# Patient Record
Sex: Female | Born: 1970 | Hispanic: No | Marital: Single | Smoking: Current some day smoker
Health system: Southern US, Community
[De-identification: ages and names within clinical notes are randomized; demographics above are authoritative.]

## PROBLEM LIST (undated history)

## (undated) HISTORY — PX: LEEP: SHX91

---

## 2021-09-30 ENCOUNTER — Telehealth: Payer: Self-pay

## 2021-09-30 NOTE — Telephone Encounter (Signed)
Telephoned patient at mobile number. Recording, patient cannot be reached at this time. [facility] unable to leave a voice message.

## 2021-10-10 NOTE — Telephone Encounter (Signed)
Telephoned patient at mobile number. Left a message with [facility] contact information. 

## 2021-10-23 NOTE — Progress Notes (Signed)
° ° ° ° ° ° °  GYNECOLOGY OFFICE VISIT NOTE  History:   Deborah Faulkner is a 51 y.o. [id number] here today for originally her colposcopy but then we realized no insurance and can be signed up for [facility].    She was wondering about something on her anal area. It has been bothering her for sometime. She has been doing A&D and vaseline and it has helped but still feels irritated at times.   She doesn't notice any bleeding and she tries not to scratch it.   She denies any abnormal vaginal discharge, bleeding, pelvic pain or other concerns.    History reviewed. No pertinent past medical history.  History reviewed. No pertinent surgical history.  The following portions of the patient's history were reviewed and updated as appropriate: allergies, current medications, past family history, past medical history, past social history, past surgical history and problem list.   Health Maintenance:   ASCUS/HPV pos No mammogram  Review of Systems:  Pertinent items noted in HPI and remainder of comprehensive ROS otherwise negative.  Physical Exam:  BP (!) 144/86    Pulse 78  CONSTITUTIONAL: Well-developed, well-nourished female in no acute distress.  HEENT:  Normocephalic, atraumatic. External right and left ear normal. No scleral icterus.  NECK: Normal range of motion, supple, no masses noted on observation SKIN: No rash noted. Not diaphoretic. No erythema. No pallor. MUSCULOSKELETAL: Normal range of motion. No edema noted. NEUROLOGIC: Alert and oriented to person, place, and time. Normal muscle tone coordination. No cranial nerve deficit noted. PSYCHIATRIC: Normal mood and affect. Normal behavior. Normal judgment and thought content.  CARDIOVASCULAR: Normal heart rate noted RESPIRATORY: Effort and breath sounds normal, no problems with respiration noted ABDOMEN: No masses noted. No other overt distention noted.    PELVIC: Normal appearing external genitalia; normal urethral meatus; normal appearing  vaginal mucosa and cervix.  No abnormal discharge noted.  Normal uterine size, no other palpable masses, no uterine or adnexal tenderness. Anal area is normal in appearance at this time except for small hemorrhoid. She points to the skin lateral to the anus that is bothersome to her, not posterior  Performed in the presence of a chaperone  Labs and Imaging No results found for this or any previous visit (from the past 168 hour(s)). No results found.  Assessment and Plan:  Siyah was seen today for procedure.  Diagnoses and all orders for this visit:  Atypical squamous cells of undetermined significance on cytologic smear of cervix (ASC-US) - She will get set up through [facility]  Positive depression screening -     Ambulatory referral to Integrated Behavioral Health - She will see intern today  Anal irritation -     triamcinolone (KENALOG) 0.025 % ointment; Apply 1 application topically 2 (two) times daily as needed. - Reviewed vulvar hygiene and avoid soaps. Reviewed cetaphil/cerave would be better I.e. cleansers - If topical steroid ointment doesn't work, recommended f/u PCP   Routine preventative health maintenance measures emphasized. Please refer to After Visit Summary for other counseling recommendations.   No follow-ups on file. - [facility] notified of pt.   Milas Hock, MD, FACOG Obstetrician & Gynecologist, [facility], [facility]

## 2021-10-24 ENCOUNTER — Telehealth: Payer: Self-pay

## 2021-10-24 ENCOUNTER — Ambulatory Visit (INDEPENDENT_AMBULATORY_CARE_PROVIDER_SITE_OTHER): Payer: Self-pay | Admitting: Obstetrics and Gynecology

## 2021-10-24 ENCOUNTER — Ambulatory Visit: Payer: Self-pay | Admitting: Clinical

## 2021-10-24 ENCOUNTER — Other Ambulatory Visit: Payer: Self-pay

## 2021-10-24 ENCOUNTER — Encounter: Payer: Self-pay | Admitting: Obstetrics and Gynecology

## 2021-10-24 ENCOUNTER — Encounter (INDEPENDENT_AMBULATORY_CARE_PROVIDER_SITE_OTHER): Payer: Self-pay | Admitting: Clinical

## 2021-10-24 VITALS — BP 144/86 | HR 78

## 2021-10-24 DIAGNOSIS — Z5941 Food insecurity: Secondary | ICD-10-CM

## 2021-10-24 DIAGNOSIS — F4322 Adjustment disorder with anxiety: Secondary | ICD-10-CM

## 2021-10-24 DIAGNOSIS — K6289 Other specified diseases of anus and rectum: Secondary | ICD-10-CM

## 2021-10-24 DIAGNOSIS — R87610 Atypical squamous cells of undetermined significance on cytologic smear of cervix (ASC-US): Secondary | ICD-10-CM

## 2021-10-24 DIAGNOSIS — Z1331 Encounter for screening for depression: Secondary | ICD-10-CM

## 2021-10-24 MED ORDER — TRIAMCINOLONE ACETONIDE 0.025 % EX OINT: 1.0000 "application " | TOPICAL_OINTMENT | Freq: Two times a day (BID) | CUTANEOUS | 1 refills | Status: DC | PRN

## 2021-10-24 NOTE — Addendum Note (Signed)
Addended by: Maxwell Marion E on: 10/24/2021 10:40 AM   Modules accepted: Orders

## 2021-10-24 NOTE — Progress Notes (Deleted)
Integrated Behavioral Health Initial In-Person Visit °  °MRN: [id number] °Name: Deborah Faulkner °  °Number of Integrated Behavioral Health Clinician visits:: 1/6 °Session Start time: 10:25 am  Session End time: 11:26 am °Total time: 60 minutes °  °Types of Service: Individual psychotherapy °  °Interpretor:No. Interpretor Name and Language: English °  °  Warm Hand Off Completed. °   °Yes °   °  °  °Subjective: °Deborah Faulkner is a 51 y.o. female accompanied by  N/A °Patient was referred by Paula Duncan, MD for Positive depression screening. °Patient reports the following symptoms/concerns: adjustment disorder with anxiety °Duration of problem: 7 months; Severity of problem: mild °  °Objective: °Mood: Anxious and Affect: Appropriate °Risk of harm to self or others: No plan to harm self or others °  °Life Context: °Family and Social: Big family and supportive boyfriend °School/Work: Not specified °Self-Care: None at the moment °Life Changes: Currently experiencing obstacles with shelter °  °Patient and/or Family's Strengths/Protective Factors: °Social connections, Social and Emotional competence, and Sense of purpose °  °Goals Addressed: °Patient will: °Reduce symptoms of: anxiety °Increase knowledge and/or ability of: stress reduction  °Demonstrate ability to: Increase healthy adjustment to current life circumstances °  °Progress towards Goals: °Ongoing and Other °  °Interventions: °Interventions utilized: Solution-Focused Strategies  °Standardized Assessments completed: GAD-7 and PHQ 9 °  °Patient and/or Family Response: Patient was receptive to therapist today. She believes that she will be ok, and is not seeking to continue therapy. She is interested in medication for anxiety and sleep. °  °Patient Centered Plan: °Patient is on the following Treatment Plan(s):  Patient would like to receive medications for anxiety and something to help her get sleep. °  °Assessment: °Patient currently experiencing anxiety. °  °Patient may  benefit from medication. °  °Plan: °Follow up with behavioral health clinician on : Patient does not want to follow up with therapist at this time, but will call if she changes her mind. °Behavioral recommendations: medication for anxiety and sleep °Referral(s):  None at this time. °"From scale of 1-10, how likely are you to follow plan?": 10 °  °Deborah Faulkner °

## 2021-10-24 NOTE — Progress Notes (Deleted)
Integrated Behavioral Health Initial In-Person Visit °  °MRN: [id number] °Name: Deborah Faulkner °  °Number of Integrated Behavioral Health Clinician visits:: 1/6 °Session Start time: 10:25 am  Session End time: 11:26 am °Total time: 60 minutes °  °Types of Service: Individual psychotherapy °  °Interpretor:No. Interpretor Name and Language: English °  °  Warm Hand Off Completed. °   °Yes °   °  °  °Subjective: °Deborah Faulkner is a 51 y.o. female accompanied by  N/A °Patient was referred by Paula Duncan, MD for Positive depression screening. °Patient reports the following symptoms/concerns: adjustment disorder with anxiety °Duration of problem: 7 months; Severity of problem: mild °  °Objective: °Mood: Anxious and Affect: Appropriate °Risk of harm to self or others: No plan to harm self or others °  °Life Context: °Family and Social: Big family and supportive boyfriend °School/Work: Not specified °Self-Care: None at the moment °Life Changes: Currently experiencing obstacles with shelter °  °Patient and/or Family's Strengths/Protective Factors: °Social connections, Social and Emotional competence, and Sense of purpose °  °Goals Addressed: °Patient will: °Reduce symptoms of: anxiety °Increase knowledge and/or ability of: stress reduction  °Demonstrate ability to: Increase healthy adjustment to current life circumstances °  °Progress towards Goals: °Ongoing and Other °  °Interventions: °Interventions utilized: Solution-Focused Strategies  °Standardized Assessments completed: GAD-7 and PHQ 9 °  °Patient and/or Family Response: Patient was receptive to therapist today. She believes that she will be ok, and is not seeking to continue therapy. She is interested in medication for anxiety and sleep. °  °Patient Centered Plan: °Patient is on the following Treatment Plan(s):  Patient would like to receive medications for anxiety and something to help her get sleep. °  °Assessment: °Patient currently experiencing anxiety. °  °Patient may  benefit from medication. °  °Plan: °Follow up with behavioral health clinician on : Patient does not want to follow up with therapist at this time, but will call if she changes her mind. °Behavioral recommendations: medication for anxiety and sleep °Referral(s):  None at this time. °"From scale of 1-10, how likely are you to follow plan?": 10 °  °[name] °

## 2021-10-24 NOTE — Patient Instructions (Signed)
For cleansers: Cetaphil or Cerave

## 2021-10-24 NOTE — Telephone Encounter (Signed)
Referral placed to [facility] during gyn visit today. Message received from [place] that pt is not eligible if she still has active [place] Medicaid. Called pt to review. Pt states she does not think [place] Medicaid is still active. Asked pt to confirm. [facility] notified to follow up with patient.

## 2021-10-24 NOTE — BH Specialist Note (Signed)
Integrated Behavioral Health Initial In-Person Visit   MRN: [id number] Name: Deborah Faulkner   Number of Integrated Behavioral Health Clinician visits:: 1/6 Session Start time: 10:25 am  Session End time: 11:26 am Total time: 60 minutes   Types of Service: Individual psychotherapy   Interpretor:No. Interpretor Name and Language: English     Warm Hand Off Completed.    Yes        Subjective: Deborah Faulkner is a 51 y.o. female accompanied by  N/A Patient was referred by Milas Hock, MD for Positive depression screening. Patient reports the following symptoms/concerns: adjustment disorder with anxiety Duration of problem: 7 months; Severity of problem: mild   Objective: Mood: Anxious and Affect: Appropriate Risk of harm to self or others: No plan to harm self or others   Life Context: Family and Social: Big family and supportive boyfriend School/Work: Not specified Self-Care: None at the moment Life Changes: Currently experiencing obstacles with shelter   Patient and/or Family's Strengths/Protective Factors: Social connections, Social and Emotional competence, and Sense of purpose   Goals Addressed: Patient will: Reduce symptoms of: anxiety Increase knowledge and/or ability of: stress reduction  Demonstrate ability to: Increase healthy adjustment to current life circumstances   Progress towards Goals: Ongoing and Other   Interventions: Interventions utilized: Solution-Focused Strategies  Standardized Assessments completed: GAD-7 and PHQ 9   Patient and/or Family Response: Patient was receptive to therapist today. She believes that she will be ok, and is not seeking to continue therapy. She is interested in medication for anxiety and sleep.   Patient Centered Plan: Patient is on the following Treatment Plan(s):  Patient would like to receive medications for anxiety and something to help her get sleep.   Assessment: Patient currently experiencing anxiety.   Patient may  benefit from medication.   Plan: Follow up with behavioral health clinician on : Patient does not want to follow up with therapist at this time, but will call if she changes her mind. Behavioral recommendations: medication for anxiety and sleep Referral(s):  None at this time. "From scale of 1-10, how likely are you to follow plan?": 10   [name]

## 2021-10-27 NOTE — Progress Notes (Signed)
error 

## 2021-10-30 NOTE — Telephone Encounter (Signed)
[facility] requests I follow up with patient regarding insurance. Called pt. Patient states she confirmed with insurance that it is no longer active. [facility] phone number given to patient to schedule. Pt reports neck pain after sleeping, would like to be evaluated for this. Explained this is not something that can be addressed by our office. Information given for [facility] and [facility].

## 2021-11-04 ENCOUNTER — Other Ambulatory Visit: Payer: Self-pay | Admitting: Obstetrics and Gynecology

## 2021-11-04 DIAGNOSIS — Z1231 Encounter for screening mammogram for malignant neoplasm of breast: Secondary | ICD-10-CM

## 2021-11-06 ENCOUNTER — Emergency Department (HOSPITAL_COMMUNITY)
Admission: EM | Admit: 2021-11-06 | Discharge: 2021-11-06 | Disposition: A | Discharge status: Home / Self Care | Payer: Self-pay | Attending: Emergency Medicine | Admitting: Emergency Medicine

## 2021-11-06 ENCOUNTER — Emergency Department (HOSPITAL_COMMUNITY): Payer: Self-pay

## 2021-11-06 ENCOUNTER — Other Ambulatory Visit: Payer: Self-pay

## 2021-11-06 DIAGNOSIS — M542 Cervicalgia: Secondary | ICD-10-CM | POA: Insufficient documentation

## 2021-11-06 DIAGNOSIS — T07XXXA Unspecified multiple injuries, initial encounter: Secondary | ICD-10-CM

## 2021-11-06 DIAGNOSIS — S0990XA Unspecified injury of head, initial encounter: Secondary | ICD-10-CM

## 2021-11-06 DIAGNOSIS — S0093XA Contusion of unspecified part of head, initial encounter: Secondary | ICD-10-CM | POA: Insufficient documentation

## 2021-11-06 MED ORDER — IBUPROFEN 600 MG PO TABS: 600.0000 mg | ORAL_TABLET | Freq: Three times a day (TID) | ORAL | 0 refills | Status: DC | PRN

## 2021-11-06 MED ORDER — HYDROCODONE-ACETAMINOPHEN 5-325 MG PO TABS
1.0000 | ORAL_TABLET | Freq: Four times a day (QID) | ORAL | 0 refills | Status: DC | PRN
Start: 2021-11-06 — End: 2023-01-12

## 2021-11-06 MED ORDER — IBUPROFEN 400 MG PO TABS
600.0000 mg | ORAL_TABLET | Freq: Once | ORAL | Status: AC
  Administered 2021-11-06: 600 mg via ORAL
  Filled 2021-11-06: qty 1
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
## 2022-01-08 ENCOUNTER — Inpatient Hospital Stay: Admission: RE | Admit: 2022-01-08 | Payer: Self-pay | Source: Ambulatory Visit

## 2022-01-08 ENCOUNTER — Ambulatory Visit: Payer: Self-pay

## 2022-01-08 DIAGNOSIS — Z1211 Encounter for screening for malignant neoplasm of colon: Secondary | ICD-10-CM

## 2022-01-14 ENCOUNTER — Ambulatory Visit: Payer: Self-pay | Admitting: Obstetrics and Gynecology

## 2022-01-29 ENCOUNTER — Ambulatory Visit: Payer: Self-pay | Admitting: Family Medicine

## 2022-02-26 ENCOUNTER — Telehealth: Payer: Self-pay | Admitting: Family Medicine

## 2022-02-26 NOTE — Telephone Encounter (Signed)
Called patient to reschedule appointment, there was no answer to the phone call so a voicemail was left with the call back number for the office and a letter was mailed.

## 2022-03-10 ENCOUNTER — Ambulatory Visit: Payer: Self-pay

## 2022-03-10 ENCOUNTER — Other Ambulatory Visit: Payer: Self-pay | Admitting: Obstetrics & Gynecology

## 2022-03-10 ENCOUNTER — Ambulatory Visit
Admission: RE | Admit: 2022-03-10 | Discharge: 2022-03-10 | Disposition: A | Discharge status: Home / Self Care | Payer: Medicaid Other | Source: Ambulatory Visit | Attending: Obstetrics & Gynecology | Admitting: Obstetrics & Gynecology

## 2022-03-10 DIAGNOSIS — Z1231 Encounter for screening mammogram for malignant neoplasm of breast: Secondary | ICD-10-CM

## 2022-03-10 DIAGNOSIS — Z1211 Encounter for screening for malignant neoplasm of colon: Secondary | ICD-10-CM

## 2022-03-20 ENCOUNTER — Ambulatory Visit: Payer: Self-pay | Admitting: Obstetrics & Gynecology

## 2022-03-31 ENCOUNTER — Ambulatory Visit: Payer: Self-pay | Admitting: Family Medicine

## 2022-04-16 ENCOUNTER — Ambulatory Visit: Payer: Self-pay | Admitting: Obstetrics & Gynecology

## 2022-07-29 ENCOUNTER — Ambulatory Visit (INDEPENDENT_AMBULATORY_CARE_PROVIDER_SITE_OTHER): Payer: Medicaid Other | Admitting: Obstetrics and Gynecology

## 2022-07-29 ENCOUNTER — Other Ambulatory Visit: Payer: Self-pay

## 2022-07-29 ENCOUNTER — Encounter: Payer: Self-pay | Admitting: Obstetrics and Gynecology

## 2022-07-29 DIAGNOSIS — N951 Menopausal and female climacteric states: Secondary | ICD-10-CM

## 2022-07-29 DIAGNOSIS — R87810 Cervical high risk human papillomavirus (HPV) DNA test positive: Secondary | ICD-10-CM

## 2022-07-29 DIAGNOSIS — R87610 Atypical squamous cells of undetermined significance on cytologic smear of cervix (ASC-US): Secondary | ICD-10-CM | POA: Diagnosis not present

## 2022-07-29 DIAGNOSIS — N882 Stricture and stenosis of cervix uteri: Secondary | ICD-10-CM | POA: Diagnosis not present

## 2022-07-29 DIAGNOSIS — Z3202 Encounter for pregnancy test, result negative: Secondary | ICD-10-CM

## 2022-07-29 LAB — POCT PREGNANCY, URINE: Preg Test, Ur: NEGATIVE

## 2022-07-29 NOTE — Progress Notes (Signed)
Patient given informed consent, signed copy in the chart, time out was performed.  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  UPT negative. Placed in lithotomy position. Cervix viewed with speculum and colposcope after application of acetic acid and lugol's solution.   Colposcopy adequate?  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 none Punctation? none Mosaicism?   none Abnormal vasculature?  None, cervix and vagina appear atrophic Biopsies? no ECC? Attempted but discontinued due to severe stenosis  COMMENTS: Patient was given post procedure instructions.  She will return in 3 months to discuss her menopause and vasomotor symptoms.  Pt is currently 10-11 months amenorrheic. Will discuss HRT at next visit.  Advise repap in 1 year to reassess cervix.  Griffin Basil, MD

## 2022-07-29 NOTE — Progress Notes (Signed)
AREA FAMILY PRACTICE PHYSICIANS  [place] ([address]) [facility] [address] [place], [place] [address] [phone] Mon-Fri 8:30-12:30, 1:30-5:00 Accepting Medicaid [facility] [address], [place], [place] [address] [phone] Mon-Fri 8:00-5:30 [facility] [address] [place], [place] [address] [phone] Mon, Tue, Thur, Fri 8:30-5:00, Wed 10:00-7:00 (closed 1-2pm) Accepting Medicaid [facility] [address], [place], [place]  [address] Phone - [phone]   Fax - [phone]  [place] ([address]) [facility] [address] [place], [place] [address] [phone] Mon-Fri 8:00-5:00 [facility] ([facility])  [address] [place], [place] [address] [phone] Mon-Fri 8:30-5:30, Sat (Oct.-Mar.) 9:00-1:00 Accepting Medicaid  [place] ([address]) [facility] [address], [place], [place] [address] [phone] Mon-Fri 8:00-5:00  [place] ([address]) [facility] [address], [place], [place] [address] [phone] Mon-Fri 8:00-5:00 [facility] [address], [place], [place] [address] [phone] Mon-Fri 8:00-5:00 [facility] [address] [place], [place] [address] [phone] Mon-Fri 8:00-5:00 [facility] [address] [place] [address] [phone] Mon-Fri 7:30-5:30  [place] ([address] & [address]) [facility] [address] [place], [place] [address] [phone] Mon-Thur 8:00-6:00 Accepting Medicaid [facility] [address] [place], [place] [address] [phone] Mon-Thur 7:30-7:30, Fri 7:30-4:30 Accepting  Medicaid [facility] [address], [place], [place]  [address] [phone]   Fax - [phone]  [place]  ([address] & [address]) [facility] [address] , [place] [address] [phone] Mon-Fri 7:00-5:00 [facility] [address], [place], [place] [address] [phone] Mon-Fri 8:00-5:00 Accepting Medicaid [facility] [address], , [place] [address] [phone] Mon-Fri 8:00-5:00 Accepting Medicaid  [place] ([address]) [facility] [place] [address] [place], [place] [address] [phone] Mon-Fri 8:00-5:00 [facility] [address], [place], [place] [address] [phone] Mon-Fri 8:00-5:00 Accepting Medicaid [facility] ([facility] [address], [place], [place] [address] [phone] Mon-Fri 8:00-5:30, Sat&Sun by appointment (phones open at 8:30) Accepting Medicaid  [place] ([address]) [facility] [address] [place], [place] [address] [phone] Mon-Thur 8:00-7:00, Fri 8:00-5:00, Sat 8:00-12:00, Sun 9:00-12:00 Accepting Medicaid [facility] [address], [place], [place] [address] [phone] Mon-Thur 8:00-5:00 Accepting Medicaid [facility] [address] [place], [place] [address] [phone] Mon-Fri 8:00-5:30, Sat (Oct.-Mar.) 9:00-1:00 Accepting Medicaid  [place] ([address]) [facility] [address], [place], [place] [address] [phone] Mon-Fri 8:00-5:00 Accepting Medicaid   [place] ([address]) [facility] [place] [address], [place], [place] [address] [phone] Mon-Fri 8:00-5:00 [facility] [place] [address], [place], [place] [address] [phone] Mon-Fri 8:00-5:00 [facility] [place] [address] [place] [address], [place],  [address] [phone] Mon-Fri 8:00-5:00 After hours clinic ([address] [place],  [address]) [phone] Mon-Fri 5:00-8:00, Sat 12:00-6:00, Sun 10:00-4:00 Accepting Medicaid [facility] [place]   [address], [place], [place]  [address] [phone]   Fax - [phone]  [place] ([address]) [facility] [place] [facility] [address], [place], [place] [address] [phone] Mon-Fri 8:00-5:00 [facility] [place] ([facility] [place]) [address], [place], [place] [address] [phone] Mon-Thur 8:00-7:00, Fri 8:00-5:00, Sat 8:00-12:00    

## 2022-08-10 ENCOUNTER — Encounter: Payer: Self-pay | Admitting: General Practice

## 2022-11-02 ENCOUNTER — Ambulatory Visit: Payer: Medicaid Other | Admitting: Obstetrics and Gynecology

## 2023-01-11 NOTE — Progress Notes (Signed)
   New Patient Office Visit  Subjective    Patient ID: Deborah Faulkner, female    DOB: 1970/12/24  Age: 52 y.o. MRN: [id number]  CC: No chief complaint on file.   HPI Deborah Faulkner presents to establish care PMH-cervical stenosis, hx of ASCUS with HR HPV Surgeries- LEEP Allergies- compazine-anxiety*** Social- alcohol, tobacco use, drug use*** FH-   Outpatient Encounter Medications as of 01/12/2023  Medication Sig   HYDROcodone-acetaminophen (NORCO/VICODIN) 5-325 MG tablet Take 1 tablet by mouth every 6 (six) hours as needed. (Patient not taking: Reported on 07/29/2022)   ibuprofen (ADVIL) 600 MG tablet Take 1 tablet (600 mg total) by mouth every 8 (eight) hours as needed for moderate pain. (Patient not taking: Reported on 07/29/2022)   triamcinolone (KENALOG) 0.025 % ointment Apply 1 application topically 2 (two) times daily as needed.   VITAMIN D PO Take by mouth. (Patient not taking: Reported on 07/29/2022)   No facility-administered encounter medications on file as of 01/12/2023.    No past medical history on file.  Past Surgical History:  Procedure Laterality Date   LEEP      No family history on file.  Social History   Socioeconomic History   Marital status: Single    Spouse name: Not on file   Number of children: Not on file   Years of education: Not on file   Highest education level: Not on file  Occupational History   Not on file  Tobacco Use   Smoking status: Some Days    Types: Cigarettes   Smokeless tobacco: Never  Substance and Sexual Activity   Alcohol use: Not Currently   Drug use: Never   Sexual activity: Yes  Other Topics Concern   Not on file  Social History Narrative   Not on file   Social Determinants of Health   Financial Resource Strain: Not on file  Food Insecurity: Food Insecurity Present (10/24/2021)   Hunger Vital Sign    Worried About Running Out of Food in the Last Year: Sometimes true    Ran Out of Food in the Last Year:  Sometimes true  Transportation Needs: Unmet Transportation Needs (10/24/2021)   PRAPARE - Administrator, Civil Service (Medical): Yes    Lack of Transportation (Non-Medical): Yes  Physical Activity: Not on file  Stress: Not on file  Social Connections: Not on file  Intimate Partner Violence: Not on file    ROS      Objective    There were no vitals taken for this visit.  Physical Exam  {Labs (Optional):23779}    Assessment & Plan:   Problem List Items Addressed This Visit   None   No follow-ups on file.   Levin Erp, MD

## 2023-01-12 ENCOUNTER — Encounter: Payer: Self-pay | Admitting: Student

## 2023-01-12 ENCOUNTER — Ambulatory Visit (INDEPENDENT_AMBULATORY_CARE_PROVIDER_SITE_OTHER): Payer: Medicaid Other | Admitting: Student

## 2023-01-12 VITALS — BP 141/91 | HR 76 | Ht 59.0 in | Wt 105.0 lb

## 2023-01-12 DIAGNOSIS — R87810 Cervical high risk human papillomavirus (HPV) DNA test positive: Secondary | ICD-10-CM

## 2023-01-12 DIAGNOSIS — Z13228 Encounter for screening for other metabolic disorders: Secondary | ICD-10-CM | POA: Diagnosis not present

## 2023-01-12 DIAGNOSIS — Z8711 Personal history of peptic ulcer disease: Secondary | ICD-10-CM

## 2023-01-12 DIAGNOSIS — R87610 Atypical squamous cells of undetermined significance on cytologic smear of cervix (ASC-US): Secondary | ICD-10-CM | POA: Diagnosis not present

## 2023-01-12 DIAGNOSIS — Z1211 Encounter for screening for malignant neoplasm of colon: Secondary | ICD-10-CM | POA: Diagnosis not present

## 2023-01-12 NOTE — Patient Instructions (Addendum)
It was great to see you! Thank you for allowing me to participate in your care!   Our plans for today:  - I will let you know what your labs show -GI referral was placed   Take care and seek immediate care sooner if you develop any concerns.  Levin Erp, MD

## 2023-01-12 NOTE — Assessment & Plan Note (Signed)
Avoid NSAIDs Will check CBC today

## 2023-01-12 NOTE — Assessment & Plan Note (Signed)
S/p LEEP procedure.  [facility] with [facility] closely and has another appointment next month.

## 2023-01-13 ENCOUNTER — Encounter: Payer: Self-pay | Admitting: Student

## 2023-01-13 LAB — CBC
Hematocrit: 38.2 % (ref 34.0–46.6)
Hemoglobin: 12.8 g/dL (ref 11.1–15.9)
MCH: 29.8 pg (ref 26.6–33.0)
MCHC: 33.5 g/dL (ref 31.5–35.7)
MCV: 89 fL (ref 79–97)
Platelets: 215 10*3/uL (ref 150–450)
RBC: 4.3 x10E6/uL (ref 3.77–5.28)
RDW: 13 % (ref 11.7–15.4)
WBC: 6.1 10*3/uL (ref 3.4–10.8)

## 2023-01-13 LAB — COMPREHENSIVE METABOLIC PANEL
ALT: 7 IU/L (ref 0–32)
AST: 18 IU/L (ref 0–40)
Albumin/Globulin Ratio: 1.8 (ref 1.2–2.2)
Albumin: 4.5 g/dL (ref 3.8–4.9)
Alkaline Phosphatase: 64 IU/L (ref 44–121)
BUN/Creatinine Ratio: 23 (ref 9–23)
BUN: 14 mg/dL (ref 6–24)
Bilirubin Total: 0.5 mg/dL (ref 0.0–1.2)
CO2: 22 mmol/L (ref 20–29)
Calcium: 9.6 mg/dL (ref 8.7–10.2)
Chloride: 103 mmol/L (ref 96–106)
Creatinine, Ser: 0.61 mg/dL (ref 0.57–1.00)
Globulin, Total: 2.5 g/dL (ref 1.5–4.5)
Glucose: 81 mg/dL (ref 70–99)
Potassium: 3.9 mmol/L (ref 3.5–5.2)
Sodium: 139 mmol/L (ref 134–144)
Total Protein: 7 g/dL (ref 6.0–8.5)
eGFR: 108 mL/min/{1.73_m2} (ref >59)

## 2023-01-13 LAB — TSH RFX ON ABNORMAL TO FREE T4: TSH: 0.84 u[IU]/mL (ref 0.450–4.500)

## 2023-01-13 LAB — HCV AB W REFLEX TO QUANT PCR: HCV Ab: NONREACTIVE

## 2023-01-13 LAB — HIV ANTIBODY (ROUTINE TESTING W REFLEX): HIV Screen 4th Generation wRfx: NONREACTIVE

## 2023-01-13 LAB — HCV INTERPRETATION

## 2023-02-03 ENCOUNTER — Emergency Department (HOSPITAL_COMMUNITY)
Admission: EM | Admit: 2023-02-03 | Discharge: 2023-02-03 | Disposition: A | Discharge status: Home / Self Care | Payer: Medicaid Other | Attending: Emergency Medicine | Admitting: Emergency Medicine

## 2023-02-03 ENCOUNTER — Other Ambulatory Visit: Payer: Self-pay

## 2023-02-03 ENCOUNTER — Emergency Department (HOSPITAL_COMMUNITY): Payer: Medicaid Other

## 2023-02-03 DIAGNOSIS — W228XXA Striking against or struck by other objects, initial encounter: Secondary | ICD-10-CM | POA: Diagnosis not present

## 2023-02-03 DIAGNOSIS — S0083XA Contusion of other part of head, initial encounter: Secondary | ICD-10-CM

## 2023-02-03 DIAGNOSIS — S01412A Laceration without foreign body of left cheek and temporomandibular area, initial encounter: Secondary | ICD-10-CM | POA: Insufficient documentation

## 2023-02-03 DIAGNOSIS — S0990XA Unspecified injury of head, initial encounter: Secondary | ICD-10-CM

## 2023-02-03 DIAGNOSIS — S0181XA Laceration without foreign body of other part of head, initial encounter: Secondary | ICD-10-CM | POA: Diagnosis present

## 2023-02-03 MED ORDER — KETOROLAC TROMETHAMINE 15 MG/ML IJ SOLN
15.0000 mg | Freq: Once | INTRAMUSCULAR | Status: AC
  Administered 2023-02-03: 15 mg via INTRAMUSCULAR
  Filled 2023-02-03: qty 1
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
## 2023-02-03 NOTE — Discharge Instructions (Signed)
Please use Tylenol or ibuprofen for pain.  You may use 600 mg ibuprofen every 6 hours or 1000 mg of Tylenol every 6 hours.  You may choose to alternate between the 2.  This would be most effective.  Not to exceed 4 g of Tylenol within 24 hours.  Not to exceed 3200 mg ibuprofen 24 hours.  
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
## 2023-02-16 ENCOUNTER — Other Ambulatory Visit (HOSPITAL_COMMUNITY): Payer: Self-pay

## 2023-02-16 ENCOUNTER — Other Ambulatory Visit: Payer: Self-pay

## 2023-02-16 ENCOUNTER — Ambulatory Visit (INDEPENDENT_AMBULATORY_CARE_PROVIDER_SITE_OTHER): Payer: Medicaid Other | Admitting: Obstetrics and Gynecology

## 2023-02-16 ENCOUNTER — Encounter: Payer: Self-pay | Admitting: Obstetrics and Gynecology

## 2023-02-16 VITALS — BP 140/85 | HR 82 | Wt 106.7 lb

## 2023-02-16 DIAGNOSIS — N951 Menopausal and female climacteric states: Secondary | ICD-10-CM | POA: Diagnosis not present

## 2023-02-16 MED ORDER — CITALOPRAM HYDROBROMIDE 10 MG PO TABS
10.0000 mg | ORAL_TABLET | Freq: Every day | ORAL | 2 refills | Status: DC
  Filled 2023-02-16 – 2023-03-12 (×2): qty 30, 30d supply, fill #0
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
## 2023-02-24 ENCOUNTER — Other Ambulatory Visit (HOSPITAL_COMMUNITY): Payer: Self-pay

## 2023-03-02 ENCOUNTER — Other Ambulatory Visit (HOSPITAL_COMMUNITY): Payer: Self-pay

## 2023-03-12 ENCOUNTER — Other Ambulatory Visit (HOSPITAL_COMMUNITY): Payer: Self-pay

## 2023-03-16 ENCOUNTER — Ambulatory Visit: Payer: Medicaid Other | Admitting: Obstetrics and Gynecology

## 2023-04-07 ENCOUNTER — Ambulatory Visit: Payer: Medicaid Other | Admitting: Obstetrics and Gynecology

## 2023-05-01 ENCOUNTER — Other Ambulatory Visit: Payer: Self-pay

## 2023-05-01 ENCOUNTER — Encounter (HOSPITAL_COMMUNITY): Payer: Self-pay | Admitting: Emergency Medicine

## 2023-05-01 ENCOUNTER — Emergency Department (HOSPITAL_COMMUNITY)
Admission: EM | Admit: 2023-05-01 | Discharge: 2023-05-01 | Disposition: A | Discharge status: Home / Self Care | Payer: Medicaid Other | Attending: Emergency Medicine | Admitting: Emergency Medicine

## 2023-05-01 DIAGNOSIS — Z4802 Encounter for removal of sutures: Secondary | ICD-10-CM | POA: Diagnosis not present

## 2023-05-01 NOTE — ED Triage Notes (Signed)
Pt presents for staple removal from scalp.  States she thinks it is about a week overdue.

## 2023-05-01 NOTE — ED Provider Notes (Signed)
[place] EMERGENCY DEPARTMENT AT [facility] Provider Note   CSN: [id number] Arrival date & time: 05/01/23  1812     History  Chief Complaint  Patient presents with   Suture / Staple Removal    Deborah Faulkner is a 52 y.o. female here for evaluation of staple removal.  Had 2 staples placed after head injury earlier this month at outside facility.  No infectious symptoms.  Area clean, dry, intact.  HPI     Home Medications Prior to Admission medications   Medication Sig Start Date End Date Taking? Authorizing Provider  citalopram (CELEXA) 10 MG tablet Take 1 tablet (10 mg total) by mouth daily. 02/16/23   Lorriane Shire, MD      Allergies    Compazine [prochlorperazine]    Review of Systems   Review of Systems  Constitutional: Negative.   HENT: Negative.    Respiratory: Negative.    Cardiovascular: Negative.   Gastrointestinal: Negative.   Genitourinary: Negative.   Musculoskeletal: Negative.   Skin:  Positive for wound.  Neurological: Negative.   All other systems reviewed and are negative.  Physical Exam Updated Vital Signs BP 119/82 (BP Location: Right Arm)   Pulse 83   Temp 98.1 F (36.7 C) (Oral)   Resp 18   SpO2 99%  Physical Exam Vitals and nursing note reviewed.  Constitutional:      General: She is not in acute distress.    Appearance: She is well-developed. She is not ill-appearing.  HENT:     Head: Normocephalic.     Comments: #2 staples to left parietal scalp. No bleeding, drainage, redness Eyes:     Pupils: Pupils are equal, round, and reactive to light.  Cardiovascular:     Rate and Rhythm: Normal rate.  Pulmonary:     Effort: No respiratory distress.  Abdominal:     General: There is no distension.  Musculoskeletal:        General: Normal range of motion.     Cervical back: Normal range of motion.  Skin:    General: Skin is warm and dry.  Neurological:     General: No focal deficit present.     Mental Status: She  is alert.  Psychiatric:        Mood and Affect: Mood normal.    ED Results / Procedures / Treatments   Labs (all labs ordered are listed, but only abnormal results are displayed) Labs Reviewed - No data to display  EKG None  Radiology No results found.  Procedures .Suture Removal  Date/Time: 05/01/2023 7:03 PM  Performed by: Linwood Dibbles, PA-C Authorized by: Linwood Dibbles, PA-C   Consent:    Consent obtained:  Verbal   Consent given by:  Patient   Risks, benefits, and alternatives were discussed: yes     Risks discussed:  Bleeding, pain and wound separation   Alternatives discussed:  Referral, observation, alternative treatment, delayed treatment and no treatment Universal protocol:    Procedure explained and questions answered to patient or proxy's satisfaction: yes     Relevant documents present and verified: yes     Test results available: yes     Imaging studies available: yes     Required blood products, implants, devices, and special equipment available: yes     Site/side marked: yes     Immediately prior to procedure, a time out was called: yes     Patient identity confirmed:  Verbally with patient Procedure details:  Wound appearance:  No signs of infection, good wound healing and clean   Number of sutures removed:  0   Number of staples removed:  2 Post-procedure details:    Post-removal:  No dressing applied   Procedure completion:  Tolerated well, no immediate complications     Medications Ordered in ED Medications - No data to display  ED Course/ Medical Decision Making/ A&P   52 year old here for evaluation of staple removal.  2 staples placed after head injury earlier this month at outside facility.  Area clean, dry, intact.  Staples removed without difficulty.  No infectious symptoms.  Will have her follow-up outpatient, return for any worsening symptoms  The patient has been appropriately medically screened and/or stabilized in the  ED. I have low suspicion for any other emergent medical condition which would require further screening, evaluation or treatment in the ED or require inpatient management.  Patient is hemodynamically stable and in no acute distress.  Patient able to ambulate in department prior to ED.  Evaluation does not show acute pathology that would require ongoing or additional emergent interventions while in the emergency department or further inpatient treatment.  I have discussed the diagnosis with the patient and answered all questions.  Pain is been managed while in the emergency department and patient has no further complaints prior to discharge.  Patient is comfortable with plan discussed in room and is stable for discharge at this time.  I have discussed strict return precautions for returning to the emergency department.  Patient was encouraged to follow-up with PCP/specialist refer to at discharge.                             Medical Decision Making Amount and/or Complexity of Data Reviewed External Data Reviewed: labs, radiology and notes.  Risk OTC drugs. Diagnosis or treatment significantly limited by social determinants of health.           Final Clinical Impression(s) / ED Diagnoses Final diagnoses:  Visit for suture removal    Rx / DC Orders ED Discharge Orders     None         Darcee Dekker A, PA-C 05/01/23 1903    Pricilla Loveless, MD 05/01/23 2255

## 2023-05-07 ENCOUNTER — Telehealth: Payer: Self-pay | Admitting: *Deleted

## 2023-05-07 NOTE — Telephone Encounter (Signed)
Attempt to reach pt for pre-visit. VM not set up. Will try other # in profile. Attempted second # in profile. Only rang 7 times then went dead. Will attempt # that is stared in profile in 5 minutes. Attempt to call stared # with quick response of VM not set up. Per protocol if pt has not resceduled pre-visit by end of day both pre-visit and procedure will be canceled.

## 2023-05-20 ENCOUNTER — Encounter: Payer: Self-pay | Admitting: Gastroenterology

## 2023-06-04 ENCOUNTER — Encounter: Payer: Medicaid Other | Admitting: Gastroenterology

## 2023-06-08 ENCOUNTER — Telehealth: Payer: Self-pay | Admitting: *Deleted

## 2023-06-08 NOTE — Telephone Encounter (Signed)
Attempt to reach pt to change date of PV or do PV. Tried both #;s in profile. Was able to reach VM on mobile # but Home # the VM has not been set up yet.

## 2023-06-08 NOTE — Telephone Encounter (Signed)
Attempt to reach pt to either do her  pre-visit today or reschedule. LM with call back # and instructed pt to call to help reschedul PV.Marland Kitchen

## 2023-06-21 ENCOUNTER — Other Ambulatory Visit (HOSPITAL_COMMUNITY): Payer: Self-pay

## 2023-06-28 NOTE — Telephone Encounter (Signed)
error 

## 2023-07-01 ENCOUNTER — Encounter: Payer: Medicaid Other | Admitting: Gastroenterology

## 2023-08-19 ENCOUNTER — Telehealth: Payer: Self-pay | Admitting: Student

## 2023-08-19 NOTE — Telephone Encounter (Signed)
Opened in error

## 2023-11-10 ENCOUNTER — Emergency Department (HOSPITAL_COMMUNITY)
Admission: EM | Admit: 2023-11-10 | Discharge: 2023-11-11 | Disposition: A | Discharge status: Home / Self Care | Payer: Medicaid Other | Attending: Emergency Medicine | Admitting: Emergency Medicine

## 2023-11-10 ENCOUNTER — Encounter (HOSPITAL_COMMUNITY): Payer: Self-pay | Admitting: *Deleted

## 2023-11-10 ENCOUNTER — Emergency Department (HOSPITAL_COMMUNITY): Payer: Medicaid Other

## 2023-11-10 ENCOUNTER — Other Ambulatory Visit: Payer: Self-pay

## 2023-11-10 DIAGNOSIS — J189 Pneumonia, unspecified organism: Secondary | ICD-10-CM | POA: Diagnosis not present

## 2023-11-10 DIAGNOSIS — R059 Cough, unspecified: Secondary | ICD-10-CM | POA: Diagnosis present

## 2023-11-10 DIAGNOSIS — J111 Influenza due to unidentified influenza virus with other respiratory manifestations: Secondary | ICD-10-CM

## 2023-11-10 LAB — BASIC METABOLIC PANEL
Anion gap: 12 (ref 5–15)
BUN: 14 mg/dL (ref 6–20)
CO2: 24 mmol/L (ref 22–32)
Calcium: 9.8 mg/dL (ref 8.9–10.3)
Chloride: 101 mmol/L (ref 98–111)
Creatinine, Ser: 0.73 mg/dL (ref 0.44–1.00)
GFR, Estimated: >60 mL/min (ref >60)
Glucose, Bld: 107 mg/dL — ABNORMAL HIGH (ref 70–99)
Potassium: 3.4 mmol/L — ABNORMAL LOW (ref 3.5–5.1)
Sodium: 137 mmol/L (ref 135–145)

## 2023-11-10 LAB — CBC
HCT: 40 % (ref 36.0–46.0)
Hemoglobin: 13.9 g/dL (ref 12.0–15.0)
MCH: 29.6 pg (ref 26.0–34.0)
MCHC: 34.8 g/dL (ref 30.0–36.0)
MCV: 85.1 fL (ref 80.0–100.0)
Platelets: 199 10*3/uL (ref 150–400)
RBC: 4.7 MIL/uL (ref 3.87–5.11)
RDW: 12.2 % (ref 11.5–15.5)
WBC: 5.9 10*3/uL (ref 4.0–10.5)
nRBC: 0 % (ref 0.0–0.2)

## 2023-11-10 MED ORDER — DOXYCYCLINE HYCLATE 100 MG PO CAPS: 100.0000 mg | ORAL_CAPSULE | Freq: Two times a day (BID) | ORAL | 0 refills | Status: DC

## 2023-11-10 MED ORDER — ACETAMINOPHEN 325 MG PO TABS: 650.0000 mg | ORAL_TABLET | Freq: Once | ORAL | Status: DC

## 2023-11-10 MED ORDER — GUAIFENESIN-CODEINE 100-10 MG/5ML PO SOLN: 10.0000 mL | Freq: Four times a day (QID) | ORAL | 0 refills | Status: DC | PRN

## 2023-11-10 MED ORDER — METHYLPREDNISOLONE 4 MG PO TBPK: ORAL_TABLET | ORAL | 0 refills | Status: DC

## 2023-11-10 MED ORDER — AMOXICILLIN-POT CLAVULANATE 875-125 MG PO TABS: 1.0000 | ORAL_TABLET | Freq: Two times a day (BID) | ORAL | 0 refills | Status: DC

## 2023-11-10 MED ORDER — IBUPROFEN 800 MG PO TABS: 800.0000 mg | ORAL_TABLET | Freq: Once | ORAL | Status: DC

## 2023-11-10 NOTE — Discharge Instructions (Signed)
Contact a health care provider if: You have a fever. You have trouble sleeping because you cannot control your cough with cough medicine. Get help right away if: Your shortness of breath becomes worse. Your chest pain increases. Your sickness becomes worse, especially if you are an older adult or have a weak immune system. You cough up blood. These symptoms may be an emergency. Get help right away. Call 911. Do not wait to see if the symptoms will go away. Do not drive yourself to the hospital. 

## 2023-11-10 NOTE — ED Provider Notes (Signed)
 [place] EMERGENCY DEPARTMENT AT [facility] Provider Note   CSN: [id number] Arrival date & time: 11/10/23  1547     History  Chief Complaint  Patient presents with   Generalized Body Aches    Deborah Faulkner is a 53 y.o. female.  Who presents emergency department with flulike illness, cough.  2 weeks ago she began with nasal congestion, runny nose, sore throat.  She had a cough.  She this was ongoing for about 2 weeks and she was beginning to feel better but about 5 days ago she had sudden change in her symptoms, worsening cough, body aches, fatigue, fever and chills.  She has headache associated with that without neck stiffness or rash.  No history of wheezing or shortness of breath  HPI     Home Medications Prior to Admission medications   Medication Sig Start Date End Date Taking? Authorizing Provider  citalopram  (CELEXA ) 10 MG tablet Take 1 tablet (10 mg total) by mouth daily. 02/16/23   Ajewole, Christana, MD      Allergies    Iodine and Compazine [prochlorperazine]    Review of Systems   Review of Systems  Physical Exam Updated Vital Signs BP 137/88 (BP Location: Right Arm)   Pulse (!) 103   Temp 99.8 F (37.7 C) (Oral)   Resp 18   Ht 4' 11 (1.499 m)   Wt 48.4 kg   SpO2 97%   BMI 21.55 kg/m  Physical Exam Vitals and nursing note reviewed.  Constitutional:      General: She is not in acute distress.    Appearance: She is well-developed. She is not diaphoretic.  HENT:     Head: Normocephalic and atraumatic.     Right Ear: External ear normal.     Left Ear: External ear normal.     Nose: Nose normal.     Mouth/Throat:     Mouth: Mucous membranes are moist.     Pharynx: Posterior oropharyngeal erythema present.  Eyes:     General: No scleral icterus.    Extraocular Movements: Extraocular movements intact.     Conjunctiva/sclera: Conjunctivae normal.     Pupils: Pupils are equal, round, and reactive to light.  Cardiovascular:     Rate and  Rhythm: Normal rate and regular rhythm.     Heart sounds: Normal heart sounds. No murmur heard.    No friction rub. No gallop.  Pulmonary:     Effort: Pulmonary effort is normal. No respiratory distress.     Breath sounds: No wheezing.  Abdominal:     General: Bowel sounds are normal. There is no distension.     Palpations: Abdomen is soft. There is no mass.     Tenderness: There is no abdominal tenderness. There is no guarding.  Musculoskeletal:     Cervical back: Normal range of motion.  Skin:    General: Skin is warm and dry.  Neurological:     Mental Status: She is alert and oriented to person, place, and time.  Psychiatric:        Behavior: Behavior normal.     ED Results / Procedures / Treatments   Labs (all labs ordered are listed, but only abnormal results are displayed) Labs Reviewed  BASIC METABOLIC PANEL - Abnormal; Notable for the following components:      Result Value   Potassium 3.4 (*)    Glucose, Bld 107 (*)    All other components within normal limits  RESP PANEL BY  RT-PCR (RSV, FLU A&B, COVID)  RVPGX2  CBC    EKG None  Radiology DG Chest 1 View Result Date: 11/10/2023 CLINICAL DATA:  Cough and fever EXAM: CHEST  1 VIEW COMPARISON:  None Available. FINDINGS: Heart size is normal. Mediastinal shadows are normal. There appears to be central bronchial thickening. No consolidation, collapse or effusion. No abnormal bone finding. IMPRESSION: Central bronchial thickening. No consolidation, collapse or effusion. Electronically Signed   By: Oneil Officer M.D.   On: 11/10/2023 17:44    Procedures Procedures    Medications Ordered in ED Medications  acetaminophen  (TYLENOL ) tablet 650 mg (has no administration in time range)  ibuprofen  (ADVIL ) tablet 800 mg (has no administration in time range)    ED Course/ Medical Decision Making/ A&P                                 Medical Decision Making Risk Prescription drug management.  Patient here with flulike  symptoms.  I reviewed the patient's labs which are reassuring, no elevated white blood cell count.  Unfortunately her respiratory panel was lost when she was seen 8 hours ago and had it drawn.  Given her symptoms of double sickening I think that it is apropos to treat her as if she has community-acquired pneumonia given the fact that she has had 2 weeks of respiratory symptoms sudden worsening and now febrile.  I visualized and interpreted patient's chest x-ray which shows no acute findings.  Patient will be discharged with codeine  cough syrup, Medrol  Dosepak, doxycycline  and Augmentin  for coverage of community-acquired pneumonia.  Discussed outpatient follow-up and return precautions.        Final Clinical Impression(s) / ED Diagnoses Final diagnoses:  Influenza-like illness  Community acquired pneumonia, unspecified laterality    Rx / DC Orders ED Discharge Orders     None         Arloa Chroman, PA-C 11/10/23 2353    Raford Lenis, MD 11/11/23 [phone]

## 2023-11-10 NOTE — ED Notes (Deleted)
 Pt eloped from lobby.

## 2023-11-10 NOTE — ED Provider Triage Note (Signed)
 Emergency Medicine Provider Triage Evaluation Note  Deborah Faulkner , a 53 y.o. female  was evaluated in triage.  Pt complains of cough, bodyaches, chills, sore throat for two weeks. Denies shob, chest pain.  Review of Systems  Positive: As above Negative: Chest pain, shob  Physical Exam  BP (!) 145/74   Pulse (!) 111   Temp 99.6 F (37.6 C) (Oral)   Resp 18   Ht 4' 11 (1.499 m)   Wt 48.4 kg   SpO2 98%   BMI 21.55 kg/m  Gen:   Awake, no distress   Resp:  Normal effort  MSK:   Moves extremities without difficulty  Other:  Mild tachycardia, appears sick with URI. No wheezing, rhonchi, stridor, rales  Medical Decision Making  Medically screening exam initiated at 4:22 PM.  Appropriate orders placed.  Deborah Faulkner was informed that the remainder of the evaluation will be completed by another provider, this initial triage assessment does not replace that evaluation, and the importance of remaining in the ED until their evaluation is complete.  Workup initiated in triage    Deborah Faulkner, [place] 11/10/23 1623

## 2023-11-10 NOTE — ED Triage Notes (Signed)
 The pt has multiple complaints cough cold fever sore throat   all for 2 weeks

## 2023-11-15 ENCOUNTER — Ambulatory Visit: Payer: Medicaid Other | Admitting: Student

## 2023-11-15 NOTE — Progress Notes (Deleted)
    SUBJECTIVE:   CHIEF COMPLAINT / HPI:   Seen 2/5 in ED Treated with codeine cough syrup, Medrol Dosepak, Doxycycline (100 BID for 7 days) and Augmentin (875-125 BID for 7 days) for coverage of community-acquired pneumonia. CXR at the time showed central bronchial thickening.  PERTINENT  PMH / PSH: hx gastric ulcer  OBJECTIVE:   There were no vitals taken for this visit.  ***  ASSESSMENT/PLAN:   No problem-specific Assessment & Plan notes found for this encounter.     Levin Erp, MD [facility]

## 2024-01-05 ENCOUNTER — Other Ambulatory Visit (HOSPITAL_COMMUNITY): Payer: Self-pay

## 2024-01-05 ENCOUNTER — Other Ambulatory Visit: Payer: Self-pay

## 2024-01-05 MED ORDER — AMOXICILLIN 500 MG PO CAPS
500.0000 mg | ORAL_CAPSULE | Freq: Three times a day (TID) | ORAL | 0 refills | Status: DC
  Filled 2024-01-05: qty 21, 7d supply, fill #0

## 2024-01-05 MED ORDER — IBUPROFEN 800 MG PO TABS
800.0000 mg | ORAL_TABLET | Freq: Three times a day (TID) | ORAL | 0 refills | Status: DC | PRN
  Filled 2024-01-05: qty 20, 7d supply, fill #0

## 2024-01-05 MED ORDER — CHLORHEXIDINE GLUCONATE 0.12 % MT SOLN
OROMUCOSAL | 0 refills | Status: DC
  Filled 2024-01-05: qty 473, 30d supply, fill #0

## 2024-01-05 MED ORDER — ACETAMINOPHEN-CODEINE 300-30 MG PO TABS
1.0000 | ORAL_TABLET | Freq: Four times a day (QID) | ORAL | 0 refills | Status: DC
  Filled 2024-01-05: qty 10, 3d supply, fill #0

## 2024-02-01 IMAGING — MG MM DIGITAL SCREENING BILAT W/ TOMO AND CAD
6 of 10 series · 6 of 30 positions shown · non-contrast
Comparison: None available.

CLINICAL DATA: Screening.

EXAM:
DIGITAL SCREENING BILATERAL MAMMOGRAM WITH TOMOSYNTHESIS AND CAD
TECHNIQUE: Bilateral screening digital craniocaudal and mediolateral oblique
mammograms were obtained. Bilateral screening digital breast
tomosynthesis was performed. The images were evaluated with
computer-aided detection.

[L MLO synth-2D]
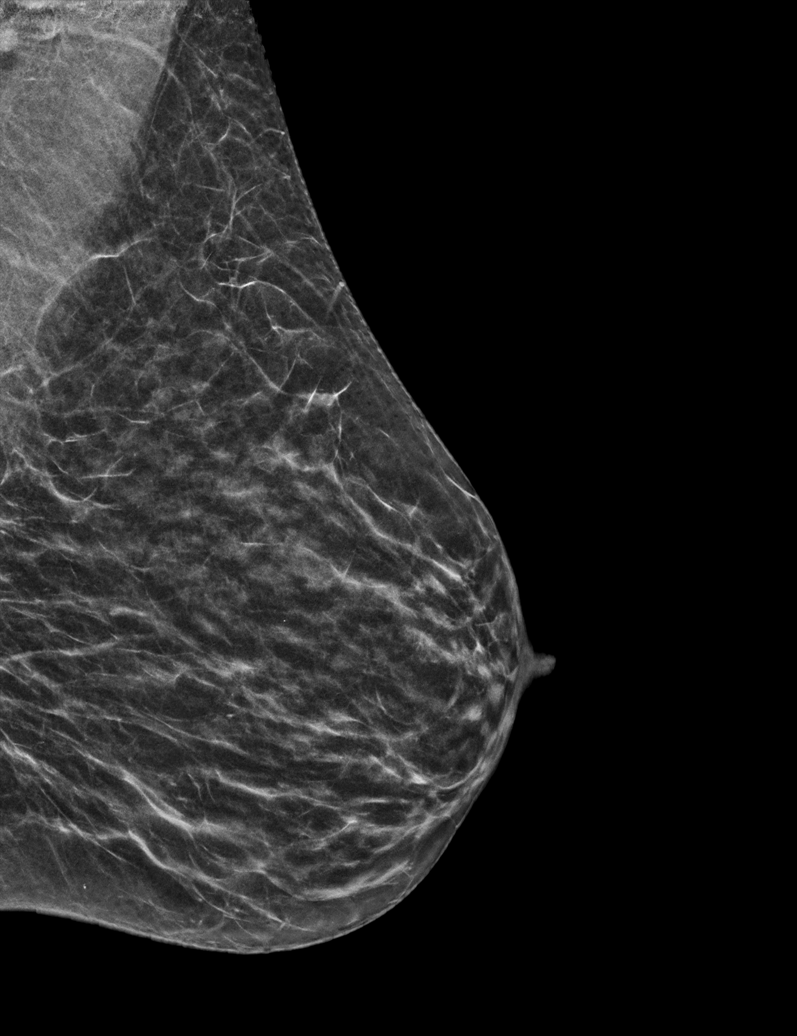

[R MLO synth-2D (1 of 2)]
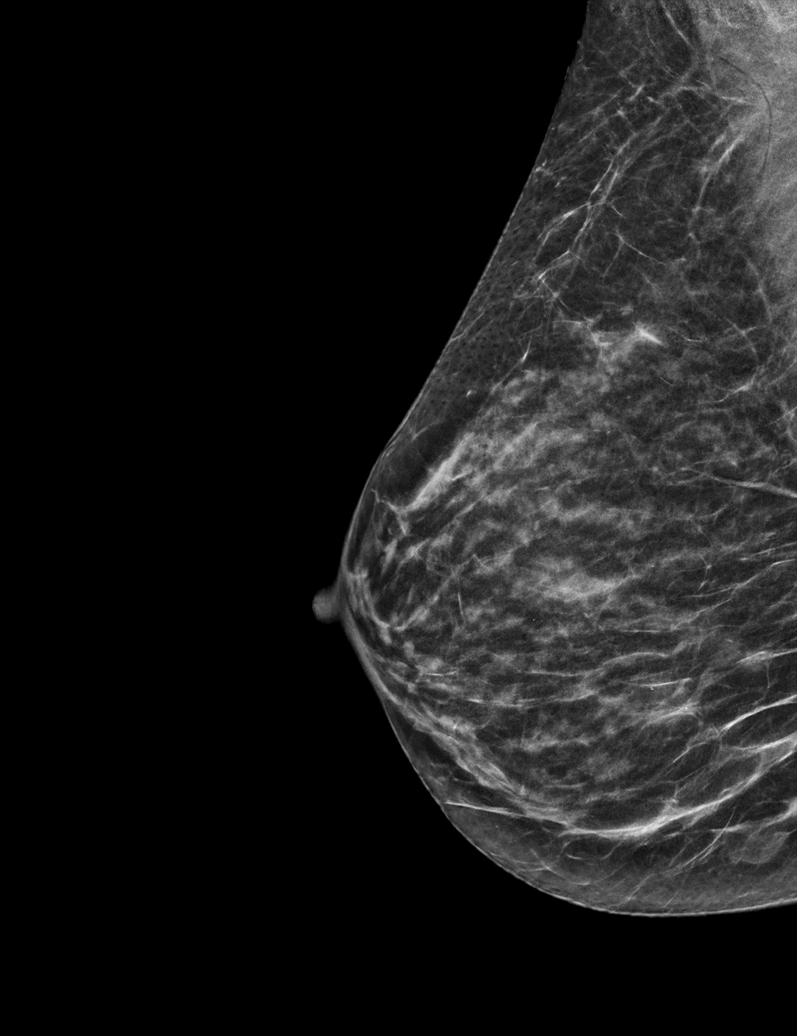

[R MLO synth-2D (2 of 2)]
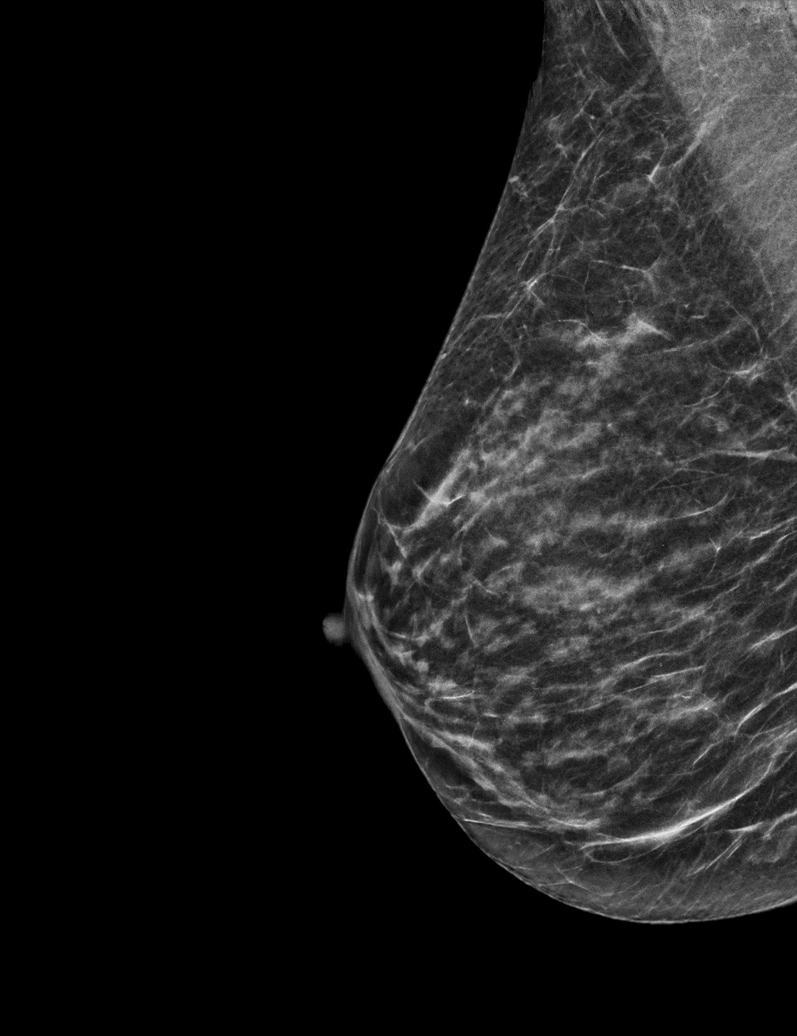

[R CC synth-2D]
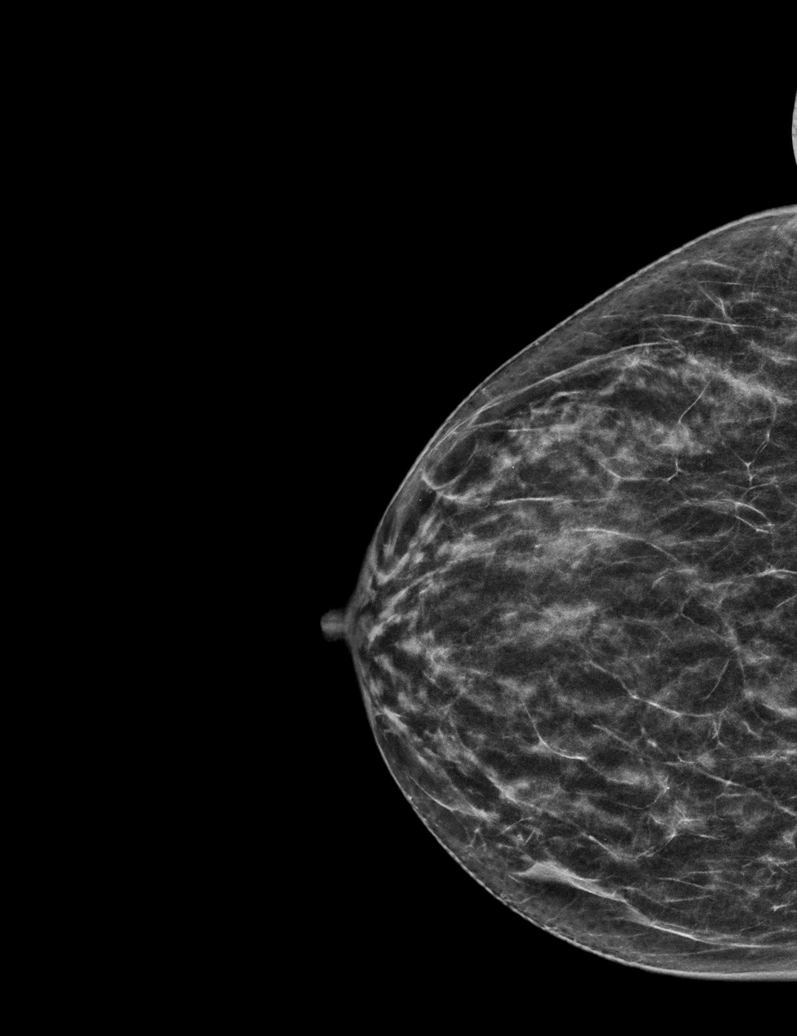

[L CC synth-2D]
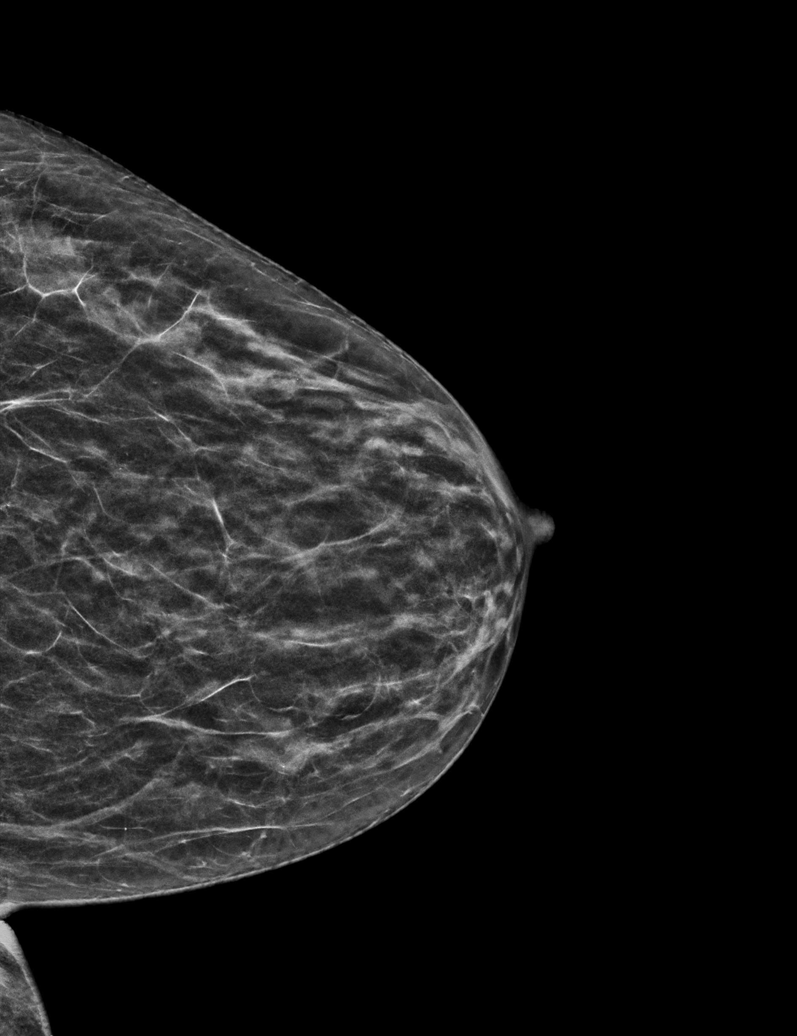

[R MLO tomo · tomo slice 24/47.0]
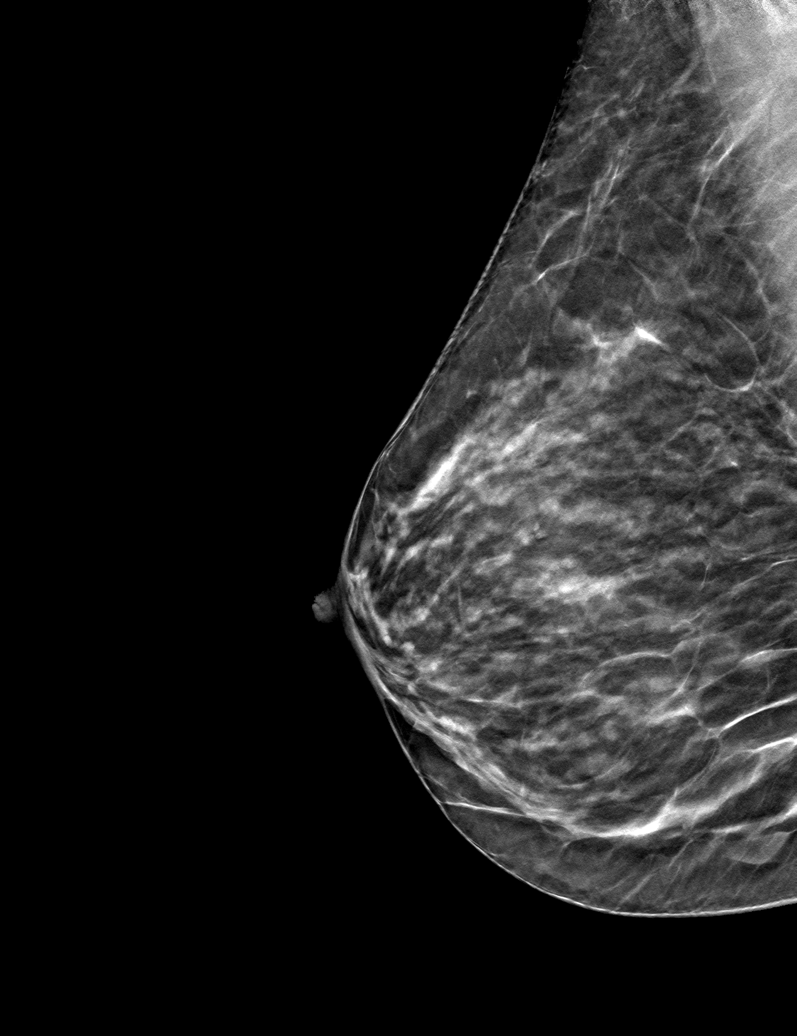

[6 of 30 positions shown; findings below may reference images not displayed]

ACR Breast Density Category c: The breast tissue is heterogeneously
dense, which may obscure small masses
FINDINGS: There are no findings suspicious for malignancy.
IMPRESSION: No mammographic evidence of malignancy. A result letter of this
screening mammogram will be mailed directly to the patient.

RECOMMENDATION:
Screening mammogram in one year. (Code:A7-O-PCM)

BI-RADS CATEGORY  1: Negative.

## 2024-02-07 ENCOUNTER — Other Ambulatory Visit (HOSPITAL_COMMUNITY)
Admission: RE | Admit: 2024-02-07 | Discharge: 2024-02-07 | Disposition: A | Discharge status: Home / Self Care | Source: Ambulatory Visit | Attending: Obstetrics and Gynecology | Admitting: Obstetrics and Gynecology

## 2024-02-07 ENCOUNTER — Other Ambulatory Visit: Payer: Self-pay

## 2024-02-07 ENCOUNTER — Encounter: Payer: Self-pay | Admitting: Obstetrics and Gynecology

## 2024-02-07 ENCOUNTER — Ambulatory Visit: Admitting: Obstetrics and Gynecology

## 2024-02-07 VITALS — BP 130/73 | HR 81 | Wt 109.1 lb

## 2024-02-07 DIAGNOSIS — Z124 Encounter for screening for malignant neoplasm of cervix: Secondary | ICD-10-CM | POA: Insufficient documentation

## 2024-02-07 DIAGNOSIS — Z01419 Encounter for gynecological examination (general) (routine) without abnormal findings: Secondary | ICD-10-CM

## 2024-02-07 LAB — CBC
Hematocrit: 38.7 % (ref 34.0–46.6)
Hemoglobin: 12.6 g/dL (ref 11.1–15.9)
MCH: 29.4 pg (ref 26.6–33.0)
MCHC: 32.6 g/dL (ref 31.5–35.7)
MCV: 90 fL (ref 79–97)
Platelets: 214 10*3/uL (ref 150–450)
RBC: 4.28 x10E6/uL (ref 3.77–5.28)
RDW: 12.9 % (ref 11.7–15.4)
WBC: 6 10*3/uL (ref 3.4–10.8)

## 2024-02-07 NOTE — Progress Notes (Signed)
 ANNUAL EXAM Patient name: Deborah Faulkner MRN [id number]  Date of birth: [birth year] Chief Complaint:   Gynecologic Exam  History of Present Illness:   Deborah Faulkner is a 53 y.o. [id number] being seen today for a routine annual exam.  Current complaints: annual  Menstrual concerns? No   Breast or nipple changes? No  Contraception use? Yes menopause Sexually active? Yes   Uses celexa  as needed, hot flashes not as bad as they were previously. Has been   No LMP recorded. Patient is postmenopausal.   The pregnancy intention screening data noted above was reviewed. Potential methods of contraception were discussed. The patient elected to proceed with No data recorded.   Last pap No results found for: "DIAGPAP", "HPVHIGH", "ADEQPAP" Last mammogram: 03/2022 BIRADS 1.  Last colonoscopy: n/a.      02/16/2023    5:14 PM 01/12/2023   11:22 AM 10/24/2021   10:17 AM  Depression screen PHQ 2/9  Decreased Interest 2 0 2  Down, Depressed, Hopeless 1 0 2  PHQ - 2 Score 3 0 4  Altered sleeping 3 0 3  Tired, decreased energy 3 3 3   Change in appetite 0 0 2  Feeling bad or failure about yourself  2 0 2  Trouble concentrating 2 0 3  Moving slowly or fidgety/restless 1 0 0  Suicidal thoughts 0 0 0  PHQ-9 Score 14 3 17         02/16/2023    5:14 PM 10/24/2021   10:17 AM  GAD 7 : Generalized Anxiety Score  Nervous, Anxious, on Edge 2 3  Control/stop worrying 1 0  Worry too much - different things 1 0  Trouble relaxing 1 3  Restless 0 0  Easily annoyed or irritable 3 2  Afraid - awful might happen 2 0  Total GAD 7 Score 10 8     Review of Systems:   Pertinent items are noted in HPI Denies any headaches, blurred vision, fatigue, shortness of breath, chest pain, abdominal pain, abnormal vaginal discharge/itching/odor/irritation, problems with periods, bowel movements, urination, or intercourse unless otherwise stated above. Pertinent History Reviewed:  Reviewed past medical,surgical,  social and family history.  Reviewed problem list, medications and allergies. Physical Assessment:   Vitals:   02/07/24 1044  BP: 130/73  Pulse: 81  Weight: 109 lb 2 oz (49.5 kg)  Body mass index is 22.04 kg/m.        Physical Examination:   General appearance - well appearing, and in no distress  Mental status - alert, oriented to person, place, and time  Psych:  She has a normal mood and affect  Skin - warm and dry, normal color, no suspicious lesions noted  Chest - effort normal, all lung fields clear to auscultation bilaterally  Heart - normal rate and regular rhythm  Abdomen - soft, nontender, nondistended, no masses or organomegaly  Pelvic -  VULVA: normal appearing vulva with no masses, tenderness or lesions   VAGINA: normal appearing vagina with normal color and discharge, no lesions   CERVIX: normal appearing cervix without discharge or lesions, no CMT  Thin prep pap is done with HR HPV cotesting  UTERUS: uterus is felt to be normal size, shape, consistency and nontender   ADNEXA: No adnexal masses or tenderness noted.  Extremities:  No swelling or varicosities noted  Chaperone present for exam  No results found for this or any previous visit (from the past 24 hours).    Assessment & Plan:  1. Well woman exam with routine gynecological exam (Primary) - Cervical cancer screening: Discussed guidelines. Pap with HPV collected - Breast Health: Encouraged self breast awareness/SBE. Discussed limits of clinical breast exam for detecting breast cancer. Discussed importance of annual MXR. Rx given for MXR - Climacteric/Sexual health: Reviewed typical and atypical symptoms of menopause/peri-menopause. Discussed PMB and to call if any amount of spotting.  - Colonoscopy: Per PCP - F/U 12 months and prn  - MM 3D SCREENING MAMMOGRAM BILATERAL BREAST; Future - CBC  2. Screening for cervical cancer Pap collected - Cytology - PAP   Orders Placed This Encounter   Procedures   MM 3D SCREENING MAMMOGRAM BILATERAL BREAST   CBC    Meds: No orders of the defined types were placed in this encounter.   Follow-up: No follow-ups on file.  Kiki Pelton, MD 02/07/2024 11:12 AM

## 2024-02-09 ENCOUNTER — Telehealth: Payer: Self-pay

## 2024-02-09 LAB — CYTOLOGY - PAP
Adequacy: ABSENT
Comment: NEGATIVE
Diagnosis: NEGATIVE
High risk HPV: NEGATIVE

## 2024-02-09 NOTE — Telephone Encounter (Signed)
 Called patient to inform on results and provider advise.  Patient verified name and DOB. No additional questions/concerns.  Honore Lux CMA

## 2024-02-09 NOTE — Telephone Encounter (Signed)
-----   Message from [place] sent at 02/09/2024  1:39 PM EDT ----- Hello, pap is normal and HPV is negative. Recommend repeat in 3-5 years

## 2024-02-11 ENCOUNTER — Ambulatory Visit
Admission: RE | Admit: 2024-02-11 | Discharge: 2024-02-11 | Disposition: A | Discharge status: Home / Self Care | Source: Ambulatory Visit | Attending: Obstetrics and Gynecology | Admitting: Obstetrics and Gynecology

## 2024-02-11 DIAGNOSIS — Z01419 Encounter for gynecological examination (general) (routine) without abnormal findings: Secondary | ICD-10-CM

## 2024-02-16 ENCOUNTER — Ambulatory Visit: Payer: Self-pay | Admitting: Obstetrics and Gynecology

## 2024-02-17 NOTE — Telephone Encounter (Signed)
-----   Message from [facility] sent at 02/16/2024 11:35 AM EDT ----- Normal mammogram, repeat in 1 year

## 2024-02-17 NOTE — Telephone Encounter (Signed)
 Called patient to inform her of normal results with follow up in 1 year. LM for patient to call the office with any further questions or concerns.

## 2024-03-06 ENCOUNTER — Encounter: Payer: Self-pay | Admitting: Gastroenterology

## 2024-04-17 ENCOUNTER — Encounter

## 2024-04-20 ENCOUNTER — Telehealth: Payer: Self-pay

## 2024-04-20 ENCOUNTER — Encounter

## 2024-04-20 NOTE — Telephone Encounter (Signed)
 Previous TE created in error. Patient rescheduled on 04/21/2024 at 1:00 pm room 53.

## 2024-04-20 NOTE — Telephone Encounter (Signed)
Multiple attempts made to reach patient- NO answer-message left for patient to call back to the office to reschedule PV appt- if patient fails to call back to the office prior to end of business day ---PV and procedure appts will be cancelled and a no show letter will be sent to the patient;

## 2024-04-20 NOTE — Telephone Encounter (Signed)
 AM RN attempted to call patient x3 for previsit for colonoscopy on 05/02/2024. Colonoscopy with Dr. Leigh was cancelled by RN.  Letter mailed out informing patient colonoscopy was cancelled.

## 2024-04-21 ENCOUNTER — Ambulatory Visit

## 2024-04-21 VITALS — Ht 59.0 in | Wt 115.0 lb

## 2024-04-21 DIAGNOSIS — Z1211 Encounter for screening for malignant neoplasm of colon: Secondary | ICD-10-CM

## 2024-04-21 MED ORDER — NA SULFATE-K SULFATE-MG SULF 17.5-3.13-1.6 GM/177ML PO SOLN: 1.0000 | Freq: Once | ORAL | 0 refills | Status: AC

## 2024-04-21 NOTE — Progress Notes (Signed)

## 2024-05-02 ENCOUNTER — Ambulatory Visit: Admitting: Gastroenterology

## 2024-05-02 ENCOUNTER — Encounter: Payer: Self-pay | Admitting: Gastroenterology

## 2024-05-02 VITALS — BP 128/82 | HR 82 | Temp 98.1°F | Resp 13 | Ht 59.0 in | Wt 115.0 lb

## 2024-05-02 DIAGNOSIS — Z1211 Encounter for screening for malignant neoplasm of colon: Secondary | ICD-10-CM

## 2024-05-02 DIAGNOSIS — K5730 Diverticulosis of large intestine without perforation or abscess without bleeding: Secondary | ICD-10-CM

## 2024-05-02 DIAGNOSIS — D120 Benign neoplasm of cecum: Secondary | ICD-10-CM

## 2024-05-02 DIAGNOSIS — K635 Polyp of colon: Secondary | ICD-10-CM | POA: Diagnosis not present

## 2024-05-02 DIAGNOSIS — K6289 Other specified diseases of anus and rectum: Secondary | ICD-10-CM

## 2024-05-02 DIAGNOSIS — K648 Other hemorrhoids: Secondary | ICD-10-CM

## 2024-05-02 MED ORDER — SODIUM CHLORIDE 0.9 % IV SOLN: 500.0000 mL | INTRAVENOUS | Status: DC

## 2024-05-02 NOTE — Progress Notes (Signed)
 Sedate, gd SR, tolerated procedure well, VSS, report to RN

## 2024-05-02 NOTE — Patient Instructions (Signed)
 Resume previous diet. Continue present medications. Awaiting pathology results.  Handouts provided on polyps, diverticulosis, and hemorrhoids.   YOU HAD AN ENDOSCOPIC PROCEDURE TODAY AT THE [facility]:   Refer to the procedure report that was given to you for any specific questions about what was found during the examination.  If the procedure report does not answer your questions, please call your gastroenterologist to clarify.  If you requested that your care partner not be given the details of your procedure findings, then the procedure report has been included in a sealed envelope for you to review at your convenience later.  YOU SHOULD EXPECT: Some feelings of bloating in the abdomen. Passage of more gas than usual.  Walking can help get rid of the air that was put into your GI tract during the procedure and reduce the bloating. If you had a lower endoscopy (such as a colonoscopy or flexible sigmoidoscopy) you may notice spotting of blood in your stool or on the toilet paper. If you underwent a bowel prep for your procedure, you may not have a normal bowel movement for a few days.  Please Note:  You might notice some irritation and congestion in your nose or some drainage.  This is from the oxygen used during your procedure.  There is no need for concern and it should clear up in a day or so.  SYMPTOMS TO REPORT IMMEDIATELY:  Following lower endoscopy (colonoscopy or flexible sigmoidoscopy):  Excessive amounts of blood in the stool  Significant tenderness or worsening of abdominal pains  Swelling of the abdomen that is new, acute  Fever of 100F or higher  For urgent or emergent issues, a gastroenterologist can be reached at any hour by calling [phone]. Do not use MyChart messaging for urgent concerns.    DIET:  We do recommend a small meal at first, but then you may proceed to your regular diet.  Drink plenty of fluids but you should avoid alcoholic beverages for 24  hours.  ACTIVITY:  You should plan to take it easy for the rest of today and you should NOT DRIVE or use heavy machinery until tomorrow (because of the sedation medicines used during the test).    FOLLOW UP: Our staff will call the number listed on your records the next business day following your procedure.  We will call around 7:15- 8:00 am to check on you and address any questions or concerns that you may have regarding the information given to you following your procedure. If we do not reach you, we will leave a message.     If any biopsies were taken you will be contacted by phone or by letter within the next 1-3 weeks.  Please call us  at [phone] if you have not heard about the biopsies in 3 weeks.    SIGNATURES/CONFIDENTIALITY: You and/or your care partner have signed paperwork which will be entered into your electronic medical record.  These signatures attest to the fact that that the information above on your After Visit Summary has been reviewed and is understood.  Full responsibility of the confidentiality of this discharge information lies with you and/or your care-partner.

## 2024-05-02 NOTE — Progress Notes (Signed)
 [facility] History and Physical   Primary Care Physician:  Lonnie Earnest, MD   Reason for Procedure:   Colon cancer screening  Plan:    colonoscopy     HPI: Deborah Faulkner is a 53 y.o. female  here for colonoscopy screening - first time exam.   . Patient denies any bowel symptoms at this time. No family history of colon cancer known. Otherwise feels well without any cardiopulmonary symptoms.   I have discussed risks / benefits of anesthesia and endoscopic procedure with Tillman Earnie Greet and they wish to proceed with the exams as outlined today.    History reviewed. No pertinent past medical history.  Past Surgical History:  Procedure Laterality Date   LEEP      Prior to Admission medications   Medication Sig Start Date End Date Taking? Authorizing Provider  citalopram  (CELEXA ) 10 MG tablet Take 1 tablet (10 mg total) by mouth daily. Patient not taking: Reported on 02/07/2024 02/16/23   Ajewole, Christana, MD  ibuprofen  (ADVIL ) 800 MG tablet Take 1 tablet (800 mg total) by mouth every 8 (eight) hours as needed for pain. Patient not taking: Reported on 02/07/2024 01/05/24       Current Outpatient Medications  Medication Sig Dispense Refill   citalopram  (CELEXA ) 10 MG tablet Take 1 tablet (10 mg total) by mouth daily. (Patient not taking: Reported on 02/07/2024) 30 tablet 2   ibuprofen  (ADVIL ) 800 MG tablet Take 1 tablet (800 mg total) by mouth every 8 (eight) hours as needed for pain. (Patient not taking: Reported on 02/07/2024) 20 tablet 0   Current Facility-Administered Medications  Medication Dose Route Frequency Provider Last Rate Last Admin   0.9 %  sodium chloride  infusion  500 mL Intravenous Continuous Satya Buttram, Elspeth SQUIBB, MD        Allergies as of 05/02/2024 - Review Complete 05/02/2024  Allergen Reaction Noted   Compazine [prochlorperazine] Anxiety 07/29/2022   Iodine Other (See Comments) 04/09/2023    Family History  Problem Relation Age of Onset    Bone cancer Mother    Hypertension Mother    Breast cancer Mother    Heart attack Father    Lung cancer Sister    Hypertension Brother    Colon cancer Neg Hx    Rectal cancer Neg Hx    Stomach cancer Neg Hx     Social History   Socioeconomic History   Marital status: Single    Spouse name: Not on file   Number of children: Not on file   Years of education: Not on file   Highest education level: Not on file  Occupational History   Not on file  Tobacco Use   Smoking status: Some Days    Types: Cigarettes   Smokeless tobacco: Never  Substance and Sexual Activity   Alcohol use: Not Currently   Drug use: Never   Sexual activity: Yes  Other Topics Concern   Not on file  Social History Narrative   Not on file   Social Drivers of Health   Financial Resource Strain: Not on file  Food Insecurity: Food Insecurity Present (02/07/2024)   Hunger Vital Sign    Worried About Running Out of Food in the Last Year: Often true    Ran Out of Food in the Last Year: Often true  Transportation Needs: Unmet Transportation Needs (02/07/2024)   PRAPARE - Administrator, Civil Service (Medical): Yes    Lack of Transportation (Non-Medical): No  Physical  Activity: Not on file  Stress: Not on file  Social Connections: Not on file  Intimate Partner Violence: Not on file    Review of Systems: All other review of systems negative except as mentioned in the HPI.  Physical Exam: Vital signs BP 131/79   Pulse 75   Temp 98.1 F (36.7 C)   Ht 4' 11 (1.499 m)   Wt 115 lb (52.2 kg)   SpO2 98%   BMI 23.23 kg/m   General:   Alert,  Well-developed, pleasant and cooperative in NAD Lungs:  Clear throughout to auscultation.   Heart:  Regular rate and rhythm Abdomen:  Soft, nontender and nondistended.   Neuro/Psych:  Alert and cooperative. Normal mood and affect. A and O x 3  Marcey Naval, MD [facility]

## 2024-05-02 NOTE — Progress Notes (Signed)
 Called to room to assist during endoscopic procedure.  Patient ID and intended procedure confirmed with present staff. Received instructions for my participation in the procedure from the performing physician.

## 2024-05-02 NOTE — Op Note (Addendum)
 [facility] Patient Name: Deborah Faulkner Procedure Date: 05/02/2024 11:23 AM MRN: [id number] Endoscopist: Elspeth P. Leigh , MD, [id number] Age: 53 Referring MD:  Date of Birth: 02-Apr-1971 Gender: Female Account #: [id number] Procedure:                Colonoscopy Indications:              Screening for colorectal malignant neoplasm, This                            is the patient's first colonoscopy Medicines:                Monitored Anesthesia Care Procedure:                Pre-Anesthesia Assessment:                           - Prior to the procedure, a History and Physical                            was performed, and patient medications and                            allergies were reviewed. The patient's tolerance of                            previous anesthesia was also reviewed. The risks                            and benefits of the procedure and the sedation                            options and risks were discussed with the patient.                            All questions were answered, and informed consent                            was obtained. Prior Anticoagulants: The patient has                            taken no anticoagulant or antiplatelet agents. ASA                            Grade Assessment: I - A normal, healthy patient.                            After reviewing the risks and benefits, the patient                            was deemed in satisfactory condition to undergo the                            procedure.  After obtaining informed consent, the colonoscope                            was passed under direct vision. Throughout the                            procedure, the patient's blood pressure, pulse, and                            oxygen saturations were monitored continuously. The                            [id number]-HQ190L Colonoscope [id number] was introduced                            through the anus and advanced to  the the cecum,                            identified by appendiceal orifice and ileocecal                            valve. The colonoscopy was technically difficult                            (restricted left colon). The patient tolerated the                            procedure well. The quality of the bowel                            preparation was adequate. The ileocecal valve,                            appendiceal orifice, and rectum were photographed. Scope In: 4:22:11 PM Scope Out: 4:52:38 PM Scope Withdrawal Time: 0 hours 23 minutes 0 seconds  Total Procedure Duration: 0 hours 30 minutes 27 seconds  Findings:                 The perianal and digital rectal examinations were                            normal.                           Two sessile polyps were found in the cecum. The                            polyps were 1 to 3 mm in size. These polyps were                            removed with a cold snare. Resection and retrieval                            were complete.  A large amount of liquid stool was found in the                            entire colon, making visualization difficult.                            Lavage of the colon was performed using copious                            amounts of fluid, resulting in clearance with                            adequate visualization.                           A few small-mouthed diverticula were found in the                            sigmoid colon.                           Anal papilla(e) were hypertrophied.                           Internal hemorrhoids were found. The hemorrhoids                            were small.                           The left colon was restricted. Pediatric                            colonoscope used to complete this exam. Cecal                            intubation was challenging. The exam was otherwise                            without abnormality. Of note, due to  small size of                            the rectum, retroflexed views were quite limited. Complications:            No immediate complications. Estimated blood loss:                            Minimal. Estimated Blood Loss:     Estimated blood loss was minimal. Impression:               - Two 1 to 3 mm polyps in the cecum, removed with a                            cold snare. Resected and retrieved.                           -  Stool in the entire examined colon leading to                            extensive lavage to achieve adequate views.                           - Diverticulosis in the sigmoid colon.                           - Restricted left colon.                           - Anal papilla(e) were hypertrophied.                           - Internal hemorrhoids.                           - The examination was otherwise normal. Recommendation:           - Patient has a contact number available for                            emergencies. The signs and symptoms of potential                            delayed complications were discussed with the                            patient. Return to normal activities tomorrow.                            Written discharge instructions were provided to the                            patient.                           - Resume previous diet.                           - Continue present medications.                           - Await pathology results. Elspeth P. Eldena Dede, MD 05/02/2024 4:58:53 PM This report has been signed electronically.

## 2024-05-02 NOTE — Progress Notes (Signed)
 Pt's states no medical or surgical changes since previsit or office visit.

## 2024-05-03 ENCOUNTER — Telehealth: Payer: Self-pay | Admitting: *Deleted

## 2024-05-03 NOTE — Telephone Encounter (Signed)
  Follow up Call-     05/02/2024    3:14 PM  Call back number  Post procedure Call Back phone  # [phone]  Permission to leave phone message Yes     Patient questions:  Do you have a fever, pain , or abdominal swelling? No. Pain Score  0 *  Have you tolerated food without any problems? Yes.    Have you been able to return to your normal activities? Yes.    Do you have any questions about your discharge instructions: Diet   No. Medications  No. Follow up visit  No.  Do you have questions or concerns about your Care? No.  Actions: * If pain score is 4 or above: No action needed, pain <4.

## 2024-05-08 ENCOUNTER — Ambulatory Visit: Payer: Self-pay | Admitting: Gastroenterology

## 2024-05-08 LAB — SURGICAL PATHOLOGY

## 2024-05-19 ENCOUNTER — Emergency Department (HOSPITAL_COMMUNITY): Admission: EM | Admit: 2024-05-19 | Discharge: 2024-05-19 | Disposition: A | Discharge status: Home / Self Care

## 2024-05-19 ENCOUNTER — Emergency Department (HOSPITAL_COMMUNITY)

## 2024-05-19 ENCOUNTER — Other Ambulatory Visit: Payer: Self-pay

## 2024-05-19 DIAGNOSIS — K8689 Other specified diseases of pancreas: Secondary | ICD-10-CM | POA: Insufficient documentation

## 2024-05-19 DIAGNOSIS — M545 Low back pain, unspecified: Secondary | ICD-10-CM | POA: Insufficient documentation

## 2024-05-19 DIAGNOSIS — X500XXA Overexertion from strenuous movement or load, initial encounter: Secondary | ICD-10-CM | POA: Insufficient documentation

## 2024-05-19 DIAGNOSIS — K5900 Constipation, unspecified: Secondary | ICD-10-CM | POA: Insufficient documentation

## 2024-05-19 LAB — COMPREHENSIVE METABOLIC PANEL WITH GFR
ALT: 10 U/L (ref 0–44)
AST: 17 U/L (ref 15–41)
Albumin: 4 g/dL (ref 3.5–5.0)
Alkaline Phosphatase: 55 U/L (ref 38–126)
Anion gap: 10 (ref 5–15)
BUN: 18 mg/dL (ref 6–20)
CO2: 20 mmol/L — ABNORMAL LOW (ref 22–32)
Calcium: 9.6 mg/dL (ref 8.9–10.3)
Chloride: 107 mmol/L (ref 98–111)
Creatinine, Ser: 0.74 mg/dL (ref 0.44–1.00)
GFR, Estimated: >60 mL/min (ref >60)
Glucose, Bld: 111 mg/dL — ABNORMAL HIGH (ref 70–99)
Potassium: 3.5 mmol/L (ref 3.5–5.1)
Sodium: 137 mmol/L (ref 135–145)
Total Bilirubin: 0.6 mg/dL (ref 0.0–1.2)
Total Protein: 6.7 g/dL (ref 6.5–8.1)

## 2024-05-19 LAB — URINALYSIS, ROUTINE W REFLEX MICROSCOPIC
Bacteria, UA: NONE SEEN
Bilirubin Urine: NEGATIVE
Glucose, UA: NEGATIVE mg/dL
Ketones, ur: NEGATIVE mg/dL
Leukocytes,Ua: NEGATIVE
Nitrite: NEGATIVE
Protein, ur: NEGATIVE mg/dL
Specific Gravity, Urine: 1.018 (ref 1.005–1.030)
pH: 7 (ref 5.0–8.0)

## 2024-05-19 LAB — CBC WITH DIFFERENTIAL/PLATELET
Abs Immature Granulocytes: 0.01 K/uL (ref 0.00–0.07)
Basophils Absolute: 0 K/uL (ref 0.0–0.1)
Basophils Relative: 1 %
Eosinophils Absolute: 0.1 K/uL (ref 0.0–0.5)
Eosinophils Relative: 1 %
HCT: 35.3 % — ABNORMAL LOW (ref 36.0–46.0)
Hemoglobin: 12.3 g/dL (ref 12.0–15.0)
Immature Granulocytes: 0 %
Lymphocytes Relative: 37 %
Lymphs Abs: 2.3 K/uL (ref 0.7–4.0)
MCH: 29.3 pg (ref 26.0–34.0)
MCHC: 34.8 g/dL (ref 30.0–36.0)
MCV: 84 fL (ref 80.0–100.0)
Monocytes Absolute: 0.5 K/uL (ref 0.1–1.0)
Monocytes Relative: 8 %
Neutro Abs: 3.4 K/uL (ref 1.7–7.7)
Neutrophils Relative %: 53 %
Platelets: 203 K/uL (ref 150–400)
RBC: 4.2 MIL/uL (ref 3.87–5.11)
RDW: 12.6 % (ref 11.5–15.5)
WBC: 6.2 K/uL (ref 4.0–10.5)
nRBC: 0 % (ref 0.0–0.2)

## 2024-05-19 MED ORDER — KETOROLAC TROMETHAMINE 15 MG/ML IJ SOLN
15.0000 mg | Freq: Once | INTRAMUSCULAR | Status: AC
  Administered 2024-05-19: 15 mg via INTRAMUSCULAR
  Filled 2024-05-19: qty 1

## 2024-05-19 MED ORDER — OXYCODONE-ACETAMINOPHEN 5-325 MG PO TABS
1.0000 | ORAL_TABLET | Freq: Once | ORAL | Status: AC
  Administered 2024-05-19: 1 via ORAL
  Filled 2024-05-19: qty 1

## 2024-05-19 NOTE — ED Provider Notes (Signed)
 [place] EMERGENCY DEPARTMENT AT [facility] Provider Note   CSN: [id number] Arrival date & time: 05/19/24  [id number]     Patient presents with: Back Pain   Deborah Faulkner is a 53 y.o. female.   53 year old female presenting emergency department for right sided pain.  Reports symptoms been intermittent for the past 3 weeks.  Did lift a heavy box 3 weeks ago, but notes her pain has not been constant.  Is been intermittent.  Seem to acutely worsen this morning and awoke her from sleep.  She has had some constipation, but passing gas.  No nausea no vomiting.  No dysuria.  No history of kidney stones.  Denies numbness tingling changes in sensation in lower extremities.  No bowel or bladder incontinence.  No saddle anesthesia.   Back Pain      Prior to Admission medications   Medication Sig Start Date End Date Taking? Authorizing Provider  citalopram  (CELEXA ) 10 MG tablet Take 1 tablet (10 mg total) by mouth daily. Patient not taking: Reported on 02/07/2024 02/16/23   Ajewole, Christana, MD  ibuprofen  (ADVIL ) 800 MG tablet Take 1 tablet (800 mg total) by mouth every 8 (eight) hours as needed for pain. Patient not taking: Reported on 02/07/2024 01/05/24       Allergies: Iodine    Review of Systems  Musculoskeletal:  Positive for back pain.    Updated Vital Signs BP (!) 145/87 (BP Location: Right Arm)   Pulse 70   Temp 97.9 F (36.6 C) (Oral)   Resp 20   SpO2 100%   Physical Exam Vitals and nursing note reviewed.  Constitutional:      General: She is not in acute distress.    Appearance: She is not toxic-appearing.  HENT:     Head: Normocephalic.     Nose: Nose normal.     Mouth/Throat:     Mouth: Mucous membranes are moist.  Eyes:     Conjunctiva/sclera: Conjunctivae normal.  Cardiovascular:     Rate and Rhythm: Normal rate and regular rhythm.  Pulmonary:     Effort: Pulmonary effort is normal.     Breath sounds: Normal breath sounds.  Abdominal:     General:  Abdomen is flat. There is no distension.     Palpations: Abdomen is soft.     Tenderness: There is no abdominal tenderness. There is right CVA tenderness. There is no guarding.  Musculoskeletal:        General: Normal range of motion.  Skin:    General: Skin is warm and dry.     Capillary Refill: Capillary refill takes less than 2 seconds.  Neurological:     Mental Status: She is alert and oriented to person, place, and time.  Psychiatric:        Mood and Affect: Mood normal.        Behavior: Behavior normal.     (all labs ordered are listed, but only abnormal results are displayed) Labs Reviewed  CBC WITH DIFFERENTIAL/PLATELET - Abnormal; Notable for the following components:      Result Value   HCT 35.3 (*)    All other components within normal limits  COMPREHENSIVE METABOLIC PANEL WITH GFR - Abnormal; Notable for the following components:   CO2 20 (*)    Glucose, Bld 111 (*)    All other components within normal limits  URINALYSIS, ROUTINE W REFLEX MICROSCOPIC - Abnormal; Notable for the following components:   APPearance CLOUDY (*)  Hgb urine dipstick MODERATE (*)    All other components within normal limits    EKG: None  Radiology: CT Renal Stone Study Result Date: 05/19/2024 CLINICAL DATA:  Abdominal and flank pain. Stone suspected. Persistent pain and lower back injured 3 weeks ago when she picked up a heavy box. EXAM: CT ABDOMEN AND PELVIS WITHOUT CONTRAST TECHNIQUE: Multidetector CT imaging of the abdomen and pelvis was performed following the standard protocol without IV contrast. RADIATION DOSE REDUCTION: This exam was performed according to the departmental dose-optimization program which includes automated exposure control, adjustment of the mA and/or kV according to patient size and/or use of iterative reconstruction technique. COMPARISON:  None Available. FINDINGS: Lower chest: Unremarkable. Hepatobiliary: No suspicious focal abnormality in the liver on this  study without intravenous contrast. There is no evidence for gallstones, gallbladder wall thickening, or pericholecystic fluid. No intrahepatic or extrahepatic biliary dilation. Pancreas: Prominence of the main pancreatic duct noted in the head and tail of the pancreas. Pancreatic duct in the tail the pancreas measures up to 6 mm diameter on 32/3. Spleen: No splenomegaly. No suspicious focal mass lesion. Adrenals/Urinary Tract: No adrenal nodule or mass. Right kidney unremarkable. A cluster of punctate 1 mm stones identified upper pole left kidney without hydronephrosis. No evidence for hydroureter. The urinary bladder appears normal for the degree of distention. Stomach/Bowel: Stomach is unremarkable. No gastric wall thickening. No evidence of outlet obstruction. Duodenum is normally positioned as is the ligament of Treitz. No small bowel wall thickening. No small bowel dilatation. The terminal ileum is normal. The appendix is normal. No gross colonic mass. No colonic wall thickening. Vascular/Lymphatic: No abdominal aortic aneurysm. No abdominal aortic atherosclerotic calcification. There is no gastrohepatic or hepatoduodenal ligament lymphadenopathy. No retroperitoneal or mesenteric lymphadenopathy. No pelvic sidewall lymphadenopathy. Reproductive: Unremarkable. Other: No intraperitoneal free fluid. Musculoskeletal: No worrisome lytic or sclerotic osseous abnormality. Chronic loss of disc height with trace retrolisthesis noted L5-S1. no evidence for an acute fracture in the lumbar spine. IMPRESSION: 1. No acute findings in the abdomen or pelvis. Specifically, no findings to explain the patient's history of abdominal and flank pain. 2. Cluster of punctate 1 mm stones upper pole left kidney without hydronephrosis. 3. Prominence of the main pancreatic duct in the head and tail of the pancreas. Pancreatic duct in the tail the pancreas measures up to 6 mm diameter. Follow-up outpatient non emergent MRI/MRCP of the  abdomen with and without contrast recommended to further evaluate. Electronically Signed   By: Camellia Candle M.D.   On: 05/19/2024 08:13     Procedures   Medications Ordered in the ED  oxyCODONE -acetaminophen  (PERCOCET/ROXICET) 5-325 MG per tablet 1 tablet (1 tablet Oral Given 05/19/24 0442)  ketorolac  (TORADOL ) 15 MG/ML injection 15 mg (15 mg Intramuscular Given 05/19/24 0720)    Clinical Course as of 05/19/24 1606  Fri May 19, 2024  0824 CT Renal Stone Study MPRESSION: 1. No acute findings in the abdomen or pelvis. Specifically, no findings to explain the patient's history of abdominal and flank pain. 2. Cluster of punctate 1 mm stones upper pole left kidney without hydronephrosis. 3. Prominence of the main pancreatic duct in the head and tail of the pancreas. Pancreatic duct in the tail the pancreas measures up to 6 mm diameter. Follow-up outpatient non emergent MRI/MRCP of the abdomen with and without contrast recommended to further evaluate.   Electronically Signed   By: Camellia Candle M.D.   On: 05/19/2024 08:13   [TY]  0825 CBC with  Differential(!) No leukocytosis to suggest infectious process.  No anemia [TY]  0825 Comprehensive metabolic panel(!) No metabolic derangements.  Normal kidney function.  No transaminitis to suggest hepatobiliary disease. [TY]  0825 Urinalysis, Routine w reflex microscopic -Urine, Clean Catch(!) Does have some hematuria.  No urinary symptoms.  Given her right flank pain concern for possible stone.  CT scan ordered, however negative for obstructing stone on the right, does have some nephrolithiasis on the left. [TY]  0825 CT Renal Stone Study Independent reviewed images, patient does have significant stool burden which may explain her symptoms. [TY]  1604 Patient feeling improved.  CT scan negative.  Vital signs reassuring.  Will discharge in stable condition suspect her symptoms secondary to constipation.  She was made aware of pancreas dilation  and need for outpatient imaging. [TY]    Clinical Course User Index [TY] Neysa Caron PARAS, DO                                 Medical Decision Making This is a 53 year old female presenting emergency department with right sided/flank pain.  She is afebrile nontachycardic, slightly hypertensive.  Physical exam reassuring no midline spinal tenderness.  Has 5-5 plantarflexion dorsiflexion.  Normal sensation in lower extremities.  No red flags to suggest cauda equina symptoms.  Does have hemoglobin in her urine we will get CT renal stone study.  No other significant metabolic derangements on her comprehensive panel or CBC.  Is feeling somewhat improved after her Percocet from triage.  See ED course for further MDM disposition  Amount and/or Complexity of Data Reviewed External Data Reviewed:     Details: No prior abdominal imaging on record Labs: ordered. Decision-making details documented in ED Course. Radiology: ordered and independent interpretation performed. Decision-making details documented in ED Course.    Details: Do not appreciate free air on her CT scan  Risk Prescription drug management. Decision regarding hospitalization. Diagnosis or treatment significantly limited by social determinants of health. Risk Details: Poor health literacy        Final diagnoses:  Dilation of pancreatic duct  Constipation, unspecified constipation type  Acute right-sided low back pain without sciatica    ED Discharge Orders     None          Neysa Caron PARAS, DO 05/19/24 1606

## 2024-05-19 NOTE — ED Triage Notes (Signed)
 Patient reports persistent pain at lower back injured 3 weeks ago when she picked up a heavy box.

## 2024-05-19 NOTE — Discharge Instructions (Signed)
 Please follow-up with your primary doctor.  You may take MiraLAX 1 capful twice a day up to 48 hours or until soft bowel movements.  Half a capful to 1 capful per day titrated to effect; soft daily bowel movements without significant straining.  Of note, your CT scan did show abnormalities with your pancreatic duct and the radiologist is recommending an outpatient MRI to exclude cancer.  Please follow-up with your primary doctor to facilitate further investigation.  Return if develop fevers, chills, uncontrolled nausea or vomiting, severe pain or any new or worsening symptoms that are concerning to you.

## 2024-05-22 ENCOUNTER — Telehealth: Payer: Self-pay | Admitting: Family Medicine

## 2024-05-22 NOTE — Telephone Encounter (Signed)
Disregard this error

## 2024-05-25 ENCOUNTER — Ambulatory Visit: Admitting: Obstetrics and Gynecology

## 2024-06-01 ENCOUNTER — Encounter (HOSPITAL_COMMUNITY): Payer: Self-pay

## 2024-06-01 ENCOUNTER — Other Ambulatory Visit (HOSPITAL_COMMUNITY): Payer: Self-pay

## 2024-06-01 ENCOUNTER — Ambulatory Visit (HOSPITAL_COMMUNITY)
Admission: RE | Admit: 2024-06-01 | Discharge: 2024-06-01 | Disposition: A | Discharge status: Home / Self Care | Source: Ambulatory Visit | Attending: Nurse Practitioner | Admitting: Nurse Practitioner

## 2024-06-01 VITALS — BP 152/81 | HR 74 | Temp 97.8°F | Resp 18 | Ht 59.0 in | Wt 105.0 lb

## 2024-06-01 DIAGNOSIS — K1370 Unspecified lesions of oral mucosa: Secondary | ICD-10-CM | POA: Diagnosis not present

## 2024-06-01 DIAGNOSIS — Z9889 Other specified postprocedural states: Secondary | ICD-10-CM

## 2024-06-01 DIAGNOSIS — R200 Anesthesia of skin: Secondary | ICD-10-CM | POA: Diagnosis not present

## 2024-06-01 DIAGNOSIS — K0510 Chronic gingivitis, plaque induced: Secondary | ICD-10-CM | POA: Diagnosis not present

## 2024-06-01 MED ORDER — CHLORHEXIDINE GLUCONATE 0.12 % MT SOLN
15.0000 mL | OROMUCOSAL | 0 refills | Status: AC
  Filled 2024-06-01: qty 473, 16d supply, fill #0
  Filled 2024-06-01: qty 210, 7d supply, fill #0

## 2024-06-01 MED ORDER — FLUCONAZOLE 150 MG PO TABS
150.0000 mg | ORAL_TABLET | ORAL | 0 refills | Status: AC
  Filled 2024-06-01: qty 2, 6d supply, fill #0

## 2024-06-01 NOTE — Discharge Instructions (Addendum)
 You were seen today for persistent numbness in your mouth and chin, difficulty eating and opening your mouth, and a sore inside your lower lip following your dental procedure on August 20. There are no signs of infection or abscess at this time. The sore appears most consistent with irritation or a canker sore, though a fungal infection is less likely.  You were prescribed Peridex  mouth rinse to use twice a day. After rinsing, do not eat or drink for at least 30 minutes so the medicine can work effectively. You were also prescribed Diflucan , with one pill to take today and another in three days, to treat a possible yeast infection in the mouth.  At home, you should eat soft foods and avoid spicy, acidic, or crunchy foods that may irritate the sore. Do not use straws, as the suction can worsen irritation. You may also use cool water rinses for comfort. Over-the-counter pain relievers such as acetaminophen  or ibuprofen  may be taken if needed.   It is important that you follow up with dentist as soon as possible for further evaluation, especially regarding the continued numbness in your mouth and chin.  Go to the emergency department immediately if you develop worsening pain, swelling, spreading redness, fever, chills, inability to open your mouth, difficulty swallowing, or if you are unable to eat or drink.

## 2024-06-01 NOTE — ED Provider Notes (Signed)
 [facility]    CSN: [id number] Arrival date & time: 06/01/24  9049      History   Chief Complaint Chief Complaint  Patient presents with   Appointment   Dental Pain    HPI Deborah Faulkner is a 53 y.o. female.   Discussed the use of AI scribe software for clinical note transcription with the patient, who gave verbal consent to proceed.   The patient presents with persistent numbness in the mouth and chin, as well as a large ulcer on the gum, following a dental deep cleaning procedure performed 8 days ago. The patient reports having 10 teeth extracted about a month prior, with the left side done first and the right side completed on August 20th.  During the recent dental visit on August 20th, the patient received a red gel or numbing cream application to the gums, followed by multiple injections. Since then, the patient has experienced ongoing numbness in the mouth and chin. The patient also notes the presence of a large ulcer on the gum, which was not present before the procedure. These symptoms have significantly impacted the patient's ability to eat and fully open her mouth.  The day after the procedure, the patient contacted the dental office but was informed that the dentist would not be available until Sunday. The patient did not return to the dental office. The patient reports being able to swallow but has difficulty eating due to the persistent numbness. On the first day following the procedure, the patient experienced excruciating pain and took ibuprofen  for relief, which is unusual as they typically do not take pain medications.  The patient received dental care at [facility] on [address] and has dental insurance through [place]. The ongoing symptoms have significantly impacted the patient's daily functioning, particularly their ability to eat normally.  The following portions of the patient's history were reviewed and updated as appropriate: allergies, current  medications, past family history, past medical history, past social history, past surgical history, and problem list.      History reviewed. No pertinent past medical history.  Patient Active Problem List   Diagnosis Date Noted   History of gastric ulcer 01/12/2023   ASCUS with positive high risk HPV cervical 07/29/2022   Cervical stenosis (uterine cervix) 07/29/2022   Vasomotor symptoms due to menopause 07/29/2022    Past Surgical History:  Procedure Laterality Date   LEEP      OB History     Gravida  13   Para  10   Term  9   Preterm  1   AB  3   Living  10      SAB  1   IAB  2   Ectopic  0   Multiple  0   Live Births  10            Home Medications    Prior to Admission medications   Medication Sig Start Date End Date Taking? Authorizing Provider  chlorhexidine  (PERIDEX ) 0.12 % solution Use as directed 15 mLs (1 capful) in the mouth or throat 2 (two) times daily at 8 am and 6 pm for 7 days. Brush teeth then swish in mouth for 2 minutes then spit out. Do not rinse mouth after use. Avoid eating, drinking, smoking and vaping for at least 30 minutes after use 06/01/24 06/17/24 Yes Annora Guderian, Lucie, FNP  fluconazole  (DIFLUCAN ) 150 MG tablet Take 1 tablet (150 mg total) by mouth every 3 (three) days for  2 doses. 06/01/24 06/07/24 Yes Iola Lukes, FNP    Family History Family History  Problem Relation Age of Onset   Bone cancer Mother    Hypertension Mother    Breast cancer Mother    Heart attack Father    Lung cancer Sister    Hypertension Brother    Colon cancer Neg Hx    Rectal cancer Neg Hx    Stomach cancer Neg Hx     Social History Social History   Tobacco Use   Smoking status: Some Days    Types: Cigarettes   Smokeless tobacco: Never  Substance Use Topics   Alcohol use: Not Currently   Drug use: Never     Allergies   Iodine   Review of Systems Review of Systems  Constitutional:  Positive for appetite change.  HENT:   Positive for dental problem. Negative for trouble swallowing.   Neurological:  Positive for headaches.  All other systems reviewed and are negative.    Physical Exam Triage Vital Signs ED Triage Vitals  Encounter Vitals Group     BP 06/01/24 1029 (!) 152/81     Girls Systolic BP Percentile --      Girls Diastolic BP Percentile --      Boys Systolic BP Percentile --      Boys Diastolic BP Percentile --      Pulse Rate 06/01/24 1029 74     Resp 06/01/24 1029 18     Temp 06/01/24 1029 97.8 F (36.6 C)     Temp Source 06/01/24 1029 Oral     SpO2 06/01/24 1029 99 %     Weight 06/01/24 1029 105 lb (47.6 kg)     Height 06/01/24 1029 4' 11 (1.499 m)     Head Circumference --      Peak Flow --      Pain Score 06/01/24 1027 5     Pain Loc --      Pain Education --      Exclude from Growth Chart --    No data found.  Updated Vital Signs BP (!) 152/81 (BP Location: Left Arm)   Pulse 74   Temp 97.8 F (36.6 C) (Oral)   Resp 18   Ht 4' 11 (1.499 m)   Wt 105 lb (47.6 kg)   SpO2 99%   BMI 21.21 kg/m   Visual Acuity Right Eye Distance:   Left Eye Distance:   Bilateral Distance:    Right Eye Near:   Left Eye Near:    Bilateral Near:     Physical Exam Vitals reviewed.  Constitutional:      General: She is awake. She is not in acute distress.    Appearance: Normal appearance. She is well-developed. She is not ill-appearing, toxic-appearing or diaphoretic.  HENT:     Head: Normocephalic.     Jaw: There is normal jaw occlusion. No tenderness, swelling, pain on movement or malocclusion.     Comments: No facial swelling noted    Right Ear: Hearing normal.     Left Ear: Hearing normal.     Nose: Nose normal.     Mouth/Throat:     Lips: Lesions present.     Mouth: Mucous membranes are moist.     Dentition: No gingival swelling, dental caries, dental abscesses or gum lesions.     Tongue: No lesions.     Palate: No lesions.     Pharynx: Oropharynx is clear. Uvula midline.  No pharyngeal swelling  or posterior oropharyngeal erythema.     Comments: Moist mucous membranes with no evidence of injury. Dentition is intact without caries, abscess, gingival swelling, or gum lesions. Tongue and palate are normal without lesions. A small yellowish-white patch is observed on the inner aspect of the lower lip, without associated swelling. Eyes:     General: Vision grossly intact.     Conjunctiva/sclera: Conjunctivae normal.  Neck:     Trachea: Phonation normal.     Comments: Patient able to manage oral secretions without difficulty.  Speech is clear. Cardiovascular:     Rate and Rhythm: Normal rate and regular rhythm.     Heart sounds: Normal heart sounds.  Pulmonary:     Effort: Pulmonary effort is normal.     Breath sounds: Normal breath sounds and air entry.     Comments: Respirations even and unlabored. Musculoskeletal:        General: Normal range of motion.     Cervical back: Full passive range of motion without pain, normal range of motion and neck supple. No edema.  Lymphadenopathy:     Cervical: No cervical adenopathy.  Skin:    General: Skin is warm and dry.  Neurological:     General: No focal deficit present.     Mental Status: She is alert and oriented to person, place, and time.  Psychiatric:        Speech: Speech normal.        Behavior: Behavior is cooperative.      UC Treatments / Results  Labs (all labs ordered are listed, but only abnormal results are displayed) Labs Reviewed - No data to display  EKG   Radiology No results found.  Procedures Procedures (including critical care time)  Medications Ordered in UC Medications - No data to display  Initial Impression / Assessment and Plan / UC Course  I have reviewed the triage vital signs and the nursing notes.  Pertinent labs & imaging results that were available during my care of the patient were reviewed by me and considered in my medical decision making (see chart for  details).     Patient presents eight days after a dental deep cleaning with multiple local anesthetic injections, reporting persistent numbness in the mouth and chin, difficulty eating and opening the mouth, and an ulcer on the inside of the lower lip. Examination shows no evidence of infection or abscess, and the ulcer is consistent with a lesion likely related to irritation or a canker sore, with fungal infection considered less likely.  No oropharyngeal swelling or respiratory compromise noted.  Differential diagnosis includes prolonged anesthetic effect, nerve injury, or other post-procedural complication. Peridex  mouth rinse was prescribed to use twice daily, with instructions to avoid eating or drinking for 30 minutes after use, and Diflucan  was given for possible oral candidiasis. Patient was advised to eat soft foods, avoid straws, acidic, or spicy foods, and to follow up with her dentist promptly for further evaluation of persistent numbness. Emergency precautions were reviewed, including worsening pain, swelling, spreading redness, fever, or inability to eat or drink, which would warrant immediate reevaluation.  Today's evaluation has revealed no signs of a dangerous process. Discussed diagnosis with patient and/or guardian. Patient and/or guardian aware of their diagnosis, possible red flag symptoms to watch out for and need for close follow up. Patient and/or guardian understands verbal and written discharge instructions. Patient and/or guardian comfortable with plan and disposition.  Patient and/or guardian has a clear mental status at this time,  good insight into illness (after discussion and teaching) and has clear judgment to make decisions regarding their care  Documentation was completed with the aid of voice recognition software. Transcription may contain typographical errors. Final Clinical Impressions(s) / UC Diagnoses   Final diagnoses:  Gum inflammation  Unspecified lesions of  oral mucosa  History of recent dental procedure  Facial numbness     Discharge Instructions      You were seen today for persistent numbness in your mouth and chin, difficulty eating and opening your mouth, and a sore inside your lower lip following your dental procedure on August 20. There are no signs of infection or abscess at this time. The sore appears most consistent with irritation or a canker sore, though a fungal infection is less likely.  You were prescribed Peridex  mouth rinse to use twice a day. After rinsing, do not eat or drink for at least 30 minutes so the medicine can work effectively. You were also prescribed Diflucan , with one pill to take today and another in three days, to treat a possible yeast infection in the mouth.  At home, you should eat soft foods and avoid spicy, acidic, or crunchy foods that may irritate the sore. Do not use straws, as the suction can worsen irritation. You may also use cool water rinses for comfort. Over-the-counter pain relievers such as acetaminophen  or ibuprofen  may be taken if needed.   It is important that you follow up with dentist as soon as possible for further evaluation, especially regarding the continued numbness in your mouth and chin.  Go to the emergency department immediately if you develop worsening pain, swelling, spreading redness, fever, chills, inability to open your mouth, difficulty swallowing, or if you are unable to eat or drink.     ED Prescriptions     Medication Sig Dispense Auth. Provider   chlorhexidine  (PERIDEX ) 0.12 % solution Use as directed 15 mLs (1 capful) in the mouth or throat 2 (two) times daily at 8 am and 6 pm for 7 days. Brush teeth then swish in mouth for 2 minutes then spit out. Do not rinse mouth after use. Avoid eating, drinking, smoking and vaping for at least 30 minutes after use 473 mL Naomi Castrogiovanni, FNP   fluconazole  (DIFLUCAN ) 150 MG tablet Take 1 tablet (150 mg total) by mouth every 3  (three) days for 2 doses. 2 tablet Iola Lukes, FNP      PDMP not reviewed this encounter.   Iola Lukes, [place] 06/01/24 1247

## 2024-06-01 NOTE — ED Triage Notes (Signed)
 Patient presenting with dental pain onset 8/20. Patient states she had deep cleaning done on 8/20. Still having numbness and discomfort ion the right side of the face from where they numbed her. States the used numbing cram and multiple injections of numbing medications in her dental procedure. States having trouble drinking and eating.  Prescriptions or OTC medications tried: No

## 2024-07-10 ENCOUNTER — Ambulatory Visit: Admitting: Advanced Practice Midwife

## 2024-08-10 ENCOUNTER — Encounter: Payer: Self-pay | Admitting: Obstetrics and Gynecology

## 2024-08-10 ENCOUNTER — Ambulatory Visit: Admitting: Obstetrics and Gynecology

## 2024-08-10 VITALS — BP 142/83 | HR 73 | Wt 100.4 lb

## 2024-08-10 DIAGNOSIS — K8689 Other specified diseases of pancreas: Secondary | ICD-10-CM | POA: Diagnosis not present

## 2024-08-10 NOTE — Progress Notes (Signed)
    GYNECOLOGY VISIT  Patient name: Deborah Faulkner MRN [id number]  Date of birth: 09/22/1971 Chief Complaint:   Follow-up  History:  Deborah Faulkner here for ED follow up. Was told needed MRI based on liver findings. No Gyn concerns today. Unaware of current PCP.  The following portions of the patient's history were reviewed and updated as appropriate: allergies, current medications, past family history, past medical history, past social history, past surgical history and problem list.   Health Maintenance:   Last pap     Component Value Date/Time   DIAGPAP  02/07/2024 1125    - Negative for intraepithelial lesion or malignancy (NILM)   HPVHIGH Negative 02/07/2024 1125   ADEQPAP  02/07/2024 1125    Satisfactory for evaluation; transformation zone component ABSENT.    Health Maintenance  Topic Date Due   Pneumococcal Vaccine for age over 92 (1 of 2 - PCV) Never done   Hepatitis B Vaccine (1 of 3 - 19+ 3-dose series) Never done   Zoster (Shingles) Vaccine (1 of 2) Never done   Flu Shot  Never done   COVID-19 Vaccine (1 - 2025-26 season) Never done   Breast Cancer Screening  02/10/2026   Pap with HPV screening  02/06/2029   DTaP/Tdap/Td vaccine (2 - Td or Tdap) 04/08/2033   Colon Cancer Screening  05/02/2034   Hepatitis C Screening  Completed   HIV Screening  Completed   HPV Vaccine  Aged Out   Meningitis B Vaccine  Aged Out      Review of Systems:  Pertinent items are noted in HPI. Comprehensive review of systems was otherwise negative.   Objective:  Physical Exam BP (!) 142/83   Pulse 73   Wt 100 lb 6.4 oz (45.5 kg)   BMI 20.28 kg/m    Physical Exam Vitals and nursing note reviewed.  Constitutional:      Appearance: Normal appearance.  HENT:     Head: Normocephalic and atraumatic.  Pulmonary:     Effort: Pulmonary effort is normal.  Skin:    General: Skin is warm and dry.  Neurological:     General: No focal deficit present.     Mental Status: She is  alert.  Psychiatric:        Mood and Affect: Mood normal.        Behavior: Behavior normal.        Thought Content: Thought content normal.        Judgment: Judgment normal.      Labs and Imaging IMPRESSION: 1. No acute findings in the abdomen or pelvis. Specifically, no findings to explain the patient's history of abdominal and flank pain. 2. Cluster of punctate 1 mm stones upper pole left kidney without hydronephrosis. 3. Prominence of the main pancreatic duct in the head and tail of the pancreas. Pancreatic duct in the tail the pancreas measures up to 6 mm diameter. Follow-up outpatient non emergent MRI/MRCP of the abdomen with and without contrast recommended to further evaluate.     Assessment & Plan:  1. Pancreatic duct dilated (Primary) Non-emergent study ordered as recommend by radiology from CT renal stone. No GYN concerns.  - MR ABDOMEN MRCP W WO CONTAST; Future    Carter Quarry, MD [facility], [facility]

## 2024-08-21 NOTE — Patient Instructions (Signed)
   Lonnie Earnest, MD    PCP - General, Family Medicine    Since 04/04/2024    [phone]

## 2024-08-22 ENCOUNTER — Telehealth: Payer: Self-pay

## 2024-08-22 NOTE — Telephone Encounter (Addendum)
 MRI scheduled for 08/28/24 at 12:00 [facility].  Called pt to inform of appointment, arrival time of 11:30 at [facility] [address] [facility], and nothing to eat or drink after 8 am the morning of.  Pt verbalized understanding of appointment.   Waddell, RN  ----- Message from Carter Quarry sent at 08/21/2024  5:32 PM EST ----- Regarding: abdominal MRI Hello,  I forgot to order this during her visit - could someone help get this scheduled? She will follow up the results with her PCP.  Thanks,  Ajewole

## 2024-08-28 ENCOUNTER — Ambulatory Visit (HOSPITAL_COMMUNITY)
Admission: RE | Admit: 2024-08-28 | Discharge: 2024-08-28 | Disposition: A | Discharge status: Home / Self Care | Source: Ambulatory Visit | Attending: Obstetrics and Gynecology | Admitting: Obstetrics and Gynecology

## 2024-08-28 DIAGNOSIS — K8689 Other specified diseases of pancreas: Secondary | ICD-10-CM | POA: Insufficient documentation

## 2024-08-28 MED ORDER — GADOBUTROL 1 MMOL/ML IV SOLN
5.0000 mL | Freq: Once | INTRAVENOUS | Status: AC | PRN
  Administered 2024-08-28: 5 mL via INTRAVENOUS

## 2024-08-29 ENCOUNTER — Other Ambulatory Visit: Payer: Self-pay | Admitting: Obstetrics and Gynecology

## 2024-08-29 ENCOUNTER — Ambulatory Visit: Payer: Self-pay | Admitting: Obstetrics and Gynecology

## 2024-08-29 DIAGNOSIS — K8689 Other specified diseases of pancreas: Secondary | ICD-10-CM

## 2024-08-30 NOTE — Telephone Encounter (Addendum)
 Attempted to contact pt.  Voicemail box not set up.   Waddell, RN  ----- Message from Carter Quarry sent at 08/29/2024  7:34 AM EST ----- Notify that MRI shows normal pancreas. Recommend follow up with PCP to review other findings ----- Message ----- From: Thayne Consuelo DEL Sent: 08/29/2024   7:06 AM EST To: Carter Quarry, MD

## 2024-09-04 NOTE — Telephone Encounter (Addendum)
 Second attempt made to notify pt of normal MRI results.  Voicemail box not set up.  Letter will be sent.   Waddell, RN    ----- Message from Carter Quarry sent at 08/29/2024  7:34 AM EST ----- Notify that MRI shows normal pancreas. Recommend follow up with PCP to review other findings ----- Message ----- From: Thayne Consuelo DEL Sent: 08/29/2024   7:06 AM EST To: Carter Quarry, MD
